# Patient Record
Sex: Male | Born: 1955 | Race: White | Hispanic: No | Marital: Single | Smoking: Current every day smoker
Health system: Southern US, Community
[De-identification: ages and names within clinical notes are randomized; demographics above are authoritative.]

## PROBLEM LIST (undated history)

## (undated) DIAGNOSIS — E119 Type 2 diabetes mellitus without complications: Secondary | ICD-10-CM

## (undated) DIAGNOSIS — I10 Essential (primary) hypertension: Secondary | ICD-10-CM

## (undated) DIAGNOSIS — G4730 Sleep apnea, unspecified: Secondary | ICD-10-CM

---

## 2012-07-09 ENCOUNTER — Emergency Department (HOSPITAL_COMMUNITY)
Admission: EM | Admit: 2012-07-09 | Discharge: 2012-07-09 | Disposition: A | Discharge status: Home / Self Care | Payer: BC Managed Care – PPO | Attending: Emergency Medicine | Admitting: Emergency Medicine

## 2012-07-09 ENCOUNTER — Encounter (HOSPITAL_COMMUNITY): Payer: Self-pay | Admitting: Cardiology

## 2012-07-09 ENCOUNTER — Emergency Department (HOSPITAL_COMMUNITY): Payer: BC Managed Care – PPO

## 2012-07-09 DIAGNOSIS — F17200 Nicotine dependence, unspecified, uncomplicated: Secondary | ICD-10-CM | POA: Insufficient documentation

## 2012-07-09 DIAGNOSIS — R209 Unspecified disturbances of skin sensation: Secondary | ICD-10-CM | POA: Insufficient documentation

## 2012-07-09 DIAGNOSIS — R7309 Other abnormal glucose: Secondary | ICD-10-CM | POA: Insufficient documentation

## 2012-07-09 DIAGNOSIS — R202 Paresthesia of skin: Secondary | ICD-10-CM

## 2012-07-09 DIAGNOSIS — R739 Hyperglycemia, unspecified: Secondary | ICD-10-CM

## 2012-07-09 DIAGNOSIS — Z7982 Long term (current) use of aspirin: Secondary | ICD-10-CM | POA: Insufficient documentation

## 2012-07-09 HISTORY — DX: Sleep apnea, unspecified: G47.30

## 2012-07-09 LAB — POCT I-STAT, CHEM 8
Calcium, Ion: 1.14 mmol/L (ref 1.12–1.23)
Chloride: 106 mEq/L (ref 96–112)
HCT: 45 % (ref 39.0–52.0)
TCO2: 22 mmol/L (ref 0–100)

## 2012-07-09 MED ORDER — METFORMIN HCL 500 MG PO TABS: 500.0000 mg | ORAL_TABLET | Freq: Two times a day (BID) | ORAL | Status: DC

## 2012-07-09 NOTE — ED Notes (Signed)
Pt to department via EMS from PCP office- pt reports he had an episode of tingling and discomfort in the right side of his chest last night. States this morning while at work he had the same episode and went to PCP office. Pt was sent here for further evaluation. Pt denies any pain at this time. Bp-136 pal Hr-94 18g RAC.

## 2012-07-09 NOTE — ED Provider Notes (Signed)
History     CSN: [id number]  Arrival date & time 07/09/12  1009   First MD Initiated Contact with Patient 07/09/12 1053      Chief Complaint  Patient presents with  . Chest Pain    (Consider location/radiation/quality/duration/timing/severity/associated sxs/prior treatment) HPI  Tingling right hand and right ribs occurred about 1130 last night occurring three times between onset and mn each occurring about one second with one episode of right shoulder tingled first episode. Patient went to sleep then notes again at 0700 getting ready to go to work and occurs more frequently from 7-8.  Continues to happen intermittently.  No similar symptoms previously, no sob, no cp, no weakness, no vision change.  PMD none.   Past Medical History  Diagnosis Date  . Sleep apnea     History reviewed. No pertinent past surgical history.  History reviewed. No pertinent family history.  History  Substance Use Topics  . Smoking status: Current Every Day Smoker  . Smokeless tobacco: Not on file  . Alcohol Use: Yes      Review of Systems  Constitutional: Negative.   HENT: Negative.   Eyes: Negative.   Respiratory: Negative.   Cardiovascular: Negative.   Gastrointestinal: Negative.   Genitourinary: Negative.   Musculoskeletal: Negative.   Neurological: Negative.   Hematological: Negative.   Psychiatric/Behavioral: Negative.     Allergies  Review of patient's allergies indicates no known allergies.  Home Medications   Current Outpatient Rx  Name  Route  Sig  Dispense  Refill  . ASPIRIN 325 MG PO TABS   Oral   Take 325 mg by mouth daily.         . CENTRUM SILVER PO   Oral   Take 1 tablet by mouth every morning.         Marland Kitchen FISH OIL PO   Oral   Take 1 capsule by mouth daily with breakfast.           BP 163/89  Pulse 92  Temp 98.3 F (36.8 C) (Oral)  Resp 14  SpO2 100%  Physical Exam  Nursing note and vitals reviewed. Constitutional: He appears well-developed  and well-nourished.  HENT:  Head: Normocephalic and atraumatic.  Right Ear: External ear normal.  Left Ear: External ear normal.  Nose: Nose normal.  Mouth/Throat: Oropharynx is clear and moist.  Eyes: Conjunctivae normal and EOM are normal. Pupils are equal, round, and reactive to light.  Neck: Normal range of motion. Neck supple.    ED Course  Procedures (including critical care time)  Labs Reviewed - No data to display No results found.   No diagnosis found.   Date: 07/09/2012  Rate: 94  Rhythm: normal sinus rhythm  QRS Axis: normal  Intervals: normal  ST/T Wave abnormalities: normal  Conduction Disutrbances:first-degree A-V block   Narrative Interpretation:   Old EKG Reviewed: none available    MDM        Patient with right sided tinglling in hand and right lateral chest with normal exam- no concerning history for cardiac etiology with no pain, tingling lasting seconds, and site in right lateral chest very discreet in size (about 2x2 cm).  Patient with normal ekg.  He does elevated bs and bp.  Discussed with patient and he will follow up with primary care this week.  He is given rx for metformin and discussed diet and exercise and smoking cessation.   Hilario Quarry, MD 07/09/12 1330

## 2012-09-23 ENCOUNTER — Encounter (HOSPITAL_COMMUNITY): Payer: Self-pay

## 2012-09-23 ENCOUNTER — Inpatient Hospital Stay (HOSPITAL_COMMUNITY)
Admission: EM | Admit: 2012-09-23 | Discharge: 2012-09-26 | DRG: 818 | Disposition: A | Discharge status: Home Health | Payer: BC Managed Care – PPO | Attending: Internal Medicine | Admitting: Internal Medicine

## 2012-09-23 ENCOUNTER — Emergency Department (HOSPITAL_COMMUNITY): Payer: BC Managed Care – PPO

## 2012-09-23 ENCOUNTER — Inpatient Hospital Stay (HOSPITAL_COMMUNITY): Payer: BC Managed Care – PPO

## 2012-09-23 DIAGNOSIS — S72141A Displaced intertrochanteric fracture of right femur, initial encounter for closed fracture: Secondary | ICD-10-CM

## 2012-09-23 DIAGNOSIS — E785 Hyperlipidemia, unspecified: Secondary | ICD-10-CM | POA: Diagnosis present

## 2012-09-23 DIAGNOSIS — S72009A Fracture of unspecified part of neck of unspecified femur, initial encounter for closed fracture: Secondary | ICD-10-CM

## 2012-09-23 DIAGNOSIS — I10 Essential (primary) hypertension: Secondary | ICD-10-CM | POA: Diagnosis present

## 2012-09-23 DIAGNOSIS — Y92410 Unspecified street and highway as the place of occurrence of the external cause: Secondary | ICD-10-CM

## 2012-09-23 DIAGNOSIS — S72001A Fracture of unspecified part of neck of right femur, initial encounter for closed fracture: Secondary | ICD-10-CM

## 2012-09-23 DIAGNOSIS — S72143A Displaced intertrochanteric fracture of unspecified femur, initial encounter for closed fracture: Principal | ICD-10-CM | POA: Diagnosis present

## 2012-09-23 DIAGNOSIS — E119 Type 2 diabetes mellitus without complications: Secondary | ICD-10-CM | POA: Diagnosis present

## 2012-09-23 DIAGNOSIS — G4730 Sleep apnea, unspecified: Secondary | ICD-10-CM | POA: Diagnosis present

## 2012-09-23 DIAGNOSIS — F17200 Nicotine dependence, unspecified, uncomplicated: Secondary | ICD-10-CM | POA: Diagnosis present

## 2012-09-23 HISTORY — DX: Type 2 diabetes mellitus without complications: E11.9

## 2012-09-23 HISTORY — DX: Essential (primary) hypertension: I10

## 2012-09-23 LAB — CBC WITH DIFFERENTIAL/PLATELET
Basophils Absolute: 0 10*3/uL (ref 0.0–0.1)
Lymphocytes Relative: 14 % (ref 12–46)
Neutro Abs: 13.6 10*3/uL — ABNORMAL HIGH (ref 1.7–7.7)
Neutrophils Relative %: 83 % — ABNORMAL HIGH (ref 43–77)
Platelets: 226 10*3/uL (ref 150–400)
RDW: 12.5 % (ref 11.5–15.5)
WBC: 16.5 10*3/uL — ABNORMAL HIGH (ref 4.0–10.5)

## 2012-09-23 LAB — BASIC METABOLIC PANEL
CO2: 21 mEq/L (ref 19–32)
Chloride: 98 mEq/L (ref 96–112)
Sodium: 135 mEq/L (ref 135–145)

## 2012-09-23 LAB — GLUCOSE, CAPILLARY: Glucose-Capillary: 180 mg/dL — ABNORMAL HIGH (ref 70–99)

## 2012-09-23 MED ORDER — INSULIN ASPART 100 UNIT/ML ~~LOC~~ SOLN
0.0000 [IU] | SUBCUTANEOUS | Status: DC
  Administered 2012-09-23: 3 [IU] via SUBCUTANEOUS
  Administered 2012-09-24: 2 [IU] via SUBCUTANEOUS
  Administered 2012-09-24: 3 [IU] via SUBCUTANEOUS

## 2012-09-23 MED ORDER — MORPHINE SULFATE 2 MG/ML IJ SOLN
0.5000 mg | INTRAMUSCULAR | Status: DC | PRN
  Administered 2012-09-23: 0.5 mg via INTRAVENOUS
  Filled 2012-09-23: qty 1

## 2012-09-23 MED ORDER — NICOTINE 14 MG/24HR TD PT24
14.0000 mg | MEDICATED_PATCH | Freq: Every day | TRANSDERMAL | Status: DC
  Administered 2012-09-23 – 2012-09-24 (×2): 14 mg via TRANSDERMAL
  Filled 2012-09-23 (×2): qty 1

## 2012-09-23 MED ORDER — LISINOPRIL 20 MG PO TABS
20.0000 mg | ORAL_TABLET | Freq: Every day | ORAL | Status: DC
  Administered 2012-09-23 – 2012-09-25 (×3): 20 mg via ORAL
  Filled 2012-09-23 (×6): qty 1

## 2012-09-23 MED ORDER — ONDANSETRON HCL 4 MG/2ML IJ SOLN
4.0000 mg | Freq: Once | INTRAMUSCULAR | Status: AC
  Administered 2012-09-23: 4 mg via INTRAVENOUS
  Filled 2012-09-23: qty 2

## 2012-09-23 MED ORDER — HYDROMORPHONE HCL PF 1 MG/ML IJ SOLN
1.0000 mg | INTRAMUSCULAR | Status: DC | PRN
  Administered 2012-09-23 – 2012-09-24 (×2): 1 mg via INTRAVENOUS
  Filled 2012-09-23 (×2): qty 1

## 2012-09-23 MED ORDER — HYDROCODONE-ACETAMINOPHEN 5-325 MG PO TABS: 1.0000 | ORAL_TABLET | Freq: Four times a day (QID) | ORAL | Status: DC | PRN

## 2012-09-23 MED ORDER — ASPIRIN 325 MG PO TABS
325.0000 mg | ORAL_TABLET | Freq: Every morning | ORAL | Status: DC
  Filled 2012-09-23: qty 1

## 2012-09-23 MED ORDER — HYDROMORPHONE HCL PF 1 MG/ML IJ SOLN
1.0000 mg | Freq: Once | INTRAMUSCULAR | Status: AC
  Administered 2012-09-23: 1 mg via INTRAVENOUS
  Filled 2012-09-23: qty 1

## 2012-09-23 MED ORDER — METOPROLOL TARTRATE 25 MG PO TABS
25.0000 mg | ORAL_TABLET | Freq: Every day | ORAL | Status: DC
  Administered 2012-09-23 – 2012-09-26 (×4): 25 mg via ORAL
  Filled 2012-09-23 (×4): qty 1

## 2012-09-23 MED ORDER — SODIUM CHLORIDE 0.9 % IV SOLN
Freq: Once | INTRAVENOUS | Status: AC
  Administered 2012-09-23: 17:00:00 via INTRAVENOUS

## 2012-09-23 MED ORDER — SIMVASTATIN 10 MG PO TABS
10.0000 mg | ORAL_TABLET | Freq: Every day | ORAL | Status: DC
  Administered 2012-09-23 – 2012-09-25 (×3): 10 mg via ORAL
  Filled 2012-09-23 (×5): qty 1
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
## 2012-09-23 NOTE — ED Notes (Signed)
shp at bedside of pt.
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
## 2012-09-23 NOTE — ED Notes (Signed)
Report called to [phone], taylor

## 2012-09-23 NOTE — ED Notes (Signed)
Report given to sam w/ [facility]
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
## 2012-09-23 NOTE — ED Notes (Signed)
Pt resting in bed, voices no complaints. Dr. zammit to see pt.

## 2012-09-23 NOTE — ED Notes (Signed)
[facility] diverted, [facility] to transport pt to [place].

## 2012-09-23 NOTE — ED Notes (Signed)
Pt transported to [place] by [facility]

## 2012-09-24 ENCOUNTER — Inpatient Hospital Stay (HOSPITAL_COMMUNITY): Payer: BC Managed Care – PPO

## 2012-09-24 ENCOUNTER — Encounter (HOSPITAL_COMMUNITY): Payer: Self-pay | Admitting: Registered Nurse

## 2012-09-24 ENCOUNTER — Encounter (HOSPITAL_COMMUNITY)
Admission: EM | Disposition: A | Discharge status: Home Health | Payer: Self-pay | Source: Home / Self Care | Attending: Internal Medicine

## 2012-09-24 ENCOUNTER — Inpatient Hospital Stay (HOSPITAL_COMMUNITY): Payer: BC Managed Care – PPO | Admitting: Anesthesiology

## 2012-09-24 ENCOUNTER — Encounter (HOSPITAL_COMMUNITY): Payer: Self-pay | Admitting: Anesthesiology

## 2012-09-24 DIAGNOSIS — E119 Type 2 diabetes mellitus without complications: Secondary | ICD-10-CM

## 2012-09-24 DIAGNOSIS — I10 Essential (primary) hypertension: Secondary | ICD-10-CM

## 2012-09-24 HISTORY — PX: TOTAL HIP ARTHROPLASTY: SHX124

## 2012-09-24 LAB — GLUCOSE, CAPILLARY
Glucose-Capillary: 227 mg/dL — ABNORMAL HIGH (ref 70–99)
Glucose-Capillary: 217 mg/dL — ABNORMAL HIGH (ref 70–99)
Glucose-Capillary: 183 mg/dL — ABNORMAL HIGH (ref 70–99)
Glucose-Capillary: 176 mg/dL — ABNORMAL HIGH (ref 70–99)
Glucose-Capillary: 172 mg/dL — ABNORMAL HIGH (ref 70–99)

## 2012-09-24 LAB — SURGICAL PCR SCREEN: MRSA, PCR: NEGATIVE

## 2012-09-24 LAB — HEMOGLOBIN A1C: Mean Plasma Glucose: 192 mg/dL — ABNORMAL HIGH (ref <117)

## 2012-09-24 SURGERY — ARTHROPLASTY, HIP, TOTAL, ANTERIOR APPROACH
Anesthesia: General | Laterality: Right | Wound class: Clean

## 2012-09-24 MED ORDER — INSULIN ASPART 100 UNIT/ML ~~LOC~~ SOLN: 0.0000 [IU] | Freq: Every day | SUBCUTANEOUS | Status: DC

## 2012-09-24 MED ORDER — LACTATED RINGERS IV SOLN
INTRAVENOUS | Status: DC | PRN
  Administered 2012-09-24: 13:00:00 via INTRAVENOUS

## 2012-09-24 MED ORDER — LACTATED RINGERS IV SOLN
INTRAVENOUS | Status: DC | PRN
  Administered 2012-09-24 (×2): via INTRAVENOUS

## 2012-09-24 MED ORDER — METOCLOPRAMIDE HCL 10 MG PO TABS: 5.0000 mg | ORAL_TABLET | Freq: Three times a day (TID) | ORAL | Status: DC | PRN

## 2012-09-24 MED ORDER — CEFAZOLIN SODIUM-DEXTROSE 2-3 GM-% IV SOLR
2.0000 g | Freq: Four times a day (QID) | INTRAVENOUS | Status: AC
  Administered 2012-09-24 – 2012-09-25 (×2): 2 g via INTRAVENOUS
  Filled 2012-09-24 (×2): qty 50

## 2012-09-24 MED ORDER — NICOTINE 21 MG/24HR TD PT24
21.0000 mg | MEDICATED_PATCH | Freq: Every day | TRANSDERMAL | Status: DC
Start: 2012-09-25 — End: 2012-09-26
  Administered 2012-09-25 – 2012-09-26 (×2): 21 mg via TRANSDERMAL
  Filled 2012-09-24 (×2): qty 1

## 2012-09-24 MED ORDER — NEOSTIGMINE METHYLSULFATE 1 MG/ML IJ SOLN
INTRAMUSCULAR | Status: DC | PRN
  Administered 2012-09-24: 4 mg via INTRAVENOUS

## 2012-09-24 MED ORDER — CEFAZOLIN SODIUM-DEXTROSE 2-3 GM-% IV SOLR
2.0000 g | Freq: Once | INTRAVENOUS | Status: AC
  Administered 2012-09-24: 2 g via INTRAVENOUS
  Filled 2012-09-24: qty 50

## 2012-09-24 MED ORDER — ACETAMINOPHEN 10 MG/ML IV SOLN: 1000.0000 mg | Freq: Once | INTRAVENOUS | Status: DC | PRN

## 2012-09-24 MED ORDER — ACETAMINOPHEN 10 MG/ML IV SOLN
INTRAVENOUS | Status: DC | PRN
  Administered 2012-09-24: 1000 mg via INTRAVENOUS

## 2012-09-24 MED ORDER — FERROUS SULFATE 325 (65 FE) MG PO TABS
325.0000 mg | ORAL_TABLET | Freq: Three times a day (TID) | ORAL | Status: DC
  Administered 2012-09-24 – 2012-09-26 (×5): 325 mg via ORAL
  Filled 2012-09-24 (×8): qty 1

## 2012-09-24 MED ORDER — METOCLOPRAMIDE HCL 5 MG/ML IJ SOLN: 5.0000 mg | Freq: Three times a day (TID) | INTRAMUSCULAR | Status: DC | PRN

## 2012-09-24 MED ORDER — SODIUM CHLORIDE 0.9 % IV SOLN
INTRAVENOUS | Status: DC
  Administered 2012-09-24 – 2012-09-26 (×4): via INTRAVENOUS

## 2012-09-24 MED ORDER — ACETAMINOPHEN 650 MG RE SUPP: 650.0000 mg | Freq: Four times a day (QID) | RECTAL | Status: DC | PRN

## 2012-09-24 MED ORDER — PROPOFOL 10 MG/ML IV BOLUS
INTRAVENOUS | Status: DC | PRN
  Administered 2012-09-24: 180 mg via INTRAVENOUS

## 2012-09-24 MED ORDER — METHOCARBAMOL 500 MG PO TABS
500.0000 mg | ORAL_TABLET | Freq: Four times a day (QID) | ORAL | Status: DC | PRN
  Administered 2012-09-24 – 2012-09-25 (×2): 500 mg via ORAL
  Filled 2012-09-24 (×2): qty 1

## 2012-09-24 MED ORDER — ROCURONIUM BROMIDE 100 MG/10ML IV SOLN
INTRAVENOUS | Status: DC | PRN
  Administered 2012-09-24: 50 mg via INTRAVENOUS
  Administered 2012-09-24: 20 mg via INTRAVENOUS

## 2012-09-24 MED ORDER — ASPIRIN EC 325 MG PO TBEC
325.0000 mg | DELAYED_RELEASE_TABLET | Freq: Two times a day (BID) | ORAL | Status: DC
  Administered 2012-09-25 – 2012-09-26 (×3): 325 mg via ORAL
  Filled 2012-09-24 (×5): qty 1

## 2012-09-24 MED ORDER — HYDROMORPHONE HCL PF 1 MG/ML IJ SOLN: 1.0000 mg | INTRAMUSCULAR | Status: DC | PRN

## 2012-09-24 MED ORDER — PROMETHAZINE HCL 25 MG/ML IJ SOLN: 6.2500 mg | INTRAMUSCULAR | Status: DC | PRN

## 2012-09-24 MED ORDER — METHOCARBAMOL 100 MG/ML IJ SOLN: 500.0000 mg | Freq: Four times a day (QID) | INTRAVENOUS | Status: DC | PRN

## 2012-09-24 MED ORDER — MIDAZOLAM HCL 5 MG/5ML IJ SOLN
INTRAMUSCULAR | Status: DC | PRN
  Administered 2012-09-24: 2 mg via INTRAVENOUS

## 2012-09-24 MED ORDER — OXYCODONE HCL 5 MG/5ML PO SOLN
5.0000 mg | Freq: Once | ORAL | Status: DC | PRN
  Filled 2012-09-24: qty 5

## 2012-09-24 MED ORDER — LIDOCAINE HCL (CARDIAC) 20 MG/ML IV SOLN: INTRAVENOUS | Status: DC | PRN

## 2012-09-24 MED ORDER — MENTHOL 3 MG MT LOZG
1.0000 | LOZENGE | OROMUCOSAL | Status: DC | PRN
  Filled 2012-09-24: qty 9

## 2012-09-24 MED ORDER — ONDANSETRON HCL 4 MG/2ML IJ SOLN: 4.0000 mg | Freq: Four times a day (QID) | INTRAMUSCULAR | Status: DC | PRN

## 2012-09-24 MED ORDER — FENTANYL CITRATE 0.05 MG/ML IJ SOLN
INTRAMUSCULAR | Status: DC | PRN
  Administered 2012-09-24: 100 ug via INTRAVENOUS
  Administered 2012-09-24 (×3): 50 ug via INTRAVENOUS

## 2012-09-24 MED ORDER — HYDROMORPHONE HCL PF 1 MG/ML IJ SOLN: 0.2500 mg | INTRAMUSCULAR | Status: DC | PRN

## 2012-09-24 MED ORDER — 0.9 % SODIUM CHLORIDE (POUR BTL) OPTIME
TOPICAL | Status: DC | PRN
  Administered 2012-09-24: 1000 mL

## 2012-09-24 MED ORDER — ONDANSETRON HCL 4 MG/2ML IJ SOLN
INTRAMUSCULAR | Status: DC | PRN
  Administered 2012-09-24: 4 mg via INTRAVENOUS

## 2012-09-24 MED ORDER — ONDANSETRON HCL 4 MG PO TABS: 4.0000 mg | ORAL_TABLET | Freq: Four times a day (QID) | ORAL | Status: DC | PRN

## 2012-09-24 MED ORDER — OXYCODONE HCL 5 MG PO TABS: 5.0000 mg | ORAL_TABLET | Freq: Once | ORAL | Status: DC | PRN

## 2012-09-24 MED ORDER — HYDROCODONE-ACETAMINOPHEN 5-325 MG PO TABS
1.0000 | ORAL_TABLET | Freq: Four times a day (QID) | ORAL | Status: DC | PRN
  Administered 2012-09-24: 1 via ORAL
  Administered 2012-09-25: 2 via ORAL
  Filled 2012-09-24: qty 1
  Filled 2012-09-24 (×2): qty 2

## 2012-09-24 MED ORDER — STERILE WATER FOR IRRIGATION IR SOLN
Status: DC | PRN
  Administered 2012-09-24: 3000 mL

## 2012-09-24 MED ORDER — PHENYLEPHRINE HCL 10 MG/ML IJ SOLN
INTRAMUSCULAR | Status: DC | PRN
  Administered 2012-09-24 (×4): 40 ug via INTRAVENOUS
  Administered 2012-09-24: 80 ug via INTRAVENOUS

## 2012-09-24 MED ORDER — MEPERIDINE HCL 50 MG/ML IJ SOLN: 6.2500 mg | INTRAMUSCULAR | Status: DC | PRN

## 2012-09-24 MED ORDER — GLYCOPYRROLATE 0.2 MG/ML IJ SOLN
INTRAMUSCULAR | Status: DC | PRN
  Administered 2012-09-24: .7 mg via INTRAVENOUS

## 2012-09-24 MED ORDER — INSULIN ASPART 100 UNIT/ML ~~LOC~~ SOLN
0.0000 [IU] | Freq: Three times a day (TID) | SUBCUTANEOUS | Status: DC
  Administered 2012-09-25 (×2): 3 [IU] via SUBCUTANEOUS
  Administered 2012-09-25: 2 [IU] via SUBCUTANEOUS
  Administered 2012-09-26: 3 [IU] via SUBCUTANEOUS
  Administered 2012-09-26: 2 [IU] via SUBCUTANEOUS

## 2012-09-24 MED ORDER — HYDROMORPHONE HCL PF 1 MG/ML IJ SOLN
0.5000 mg | INTRAMUSCULAR | Status: DC | PRN
  Administered 2012-09-24 – 2012-09-25 (×2): 1 mg via INTRAVENOUS
  Filled 2012-09-24 (×2): qty 1

## 2012-09-24 MED ORDER — EPHEDRINE SULFATE 50 MG/ML IJ SOLN
INTRAMUSCULAR | Status: DC | PRN
  Administered 2012-09-24 (×2): 10 mg via INTRAVENOUS

## 2012-09-24 MED ORDER — ACETAMINOPHEN 325 MG PO TABS: 650.0000 mg | ORAL_TABLET | Freq: Four times a day (QID) | ORAL | Status: DC | PRN

## 2012-09-24 MED ORDER — PHENOL 1.4 % MT LIQD
1.0000 | OROMUCOSAL | Status: DC | PRN
  Filled 2012-09-24: qty 177

## 2012-09-24 SURGICAL SUPPLY — 37 items
BAG ZIPLOCK 12X15 (MISCELLANEOUS) ×4 IMPLANT
BLADE SAGITTAL 25.0X1.27X90 (BLADE) ×2 IMPLANT
CLOTH BEACON ORANGE TIMEOUT ST (SAFETY) ×2 IMPLANT
DERMABOND ADVANCED (GAUZE/BANDAGES/DRESSINGS) ×1
DERMABOND ADVANCED .7 DNX12 (GAUZE/BANDAGES/DRESSINGS) ×1 IMPLANT
DRAPE C-ARM 42X72 X-RAY (DRAPES) ×2 IMPLANT
DRAPE STERI IOBAN 125X83 (DRAPES) ×2 IMPLANT
DRAPE U-SHAPE 47X51 STRL (DRAPES) ×6 IMPLANT
DRSG AQUACEL AG ADV 3.5X 6 (GAUZE/BANDAGES/DRESSINGS) ×2 IMPLANT
DRSG AQUACEL AG ADV 3.5X10 (GAUZE/BANDAGES/DRESSINGS) ×2 IMPLANT
DRSG TEGADERM 4X4.75 (GAUZE/BANDAGES/DRESSINGS) ×2 IMPLANT
DURAPREP 26ML APPLICATOR (WOUND CARE) ×2 IMPLANT
ELECT BLADE TIP CTD 4 INCH (ELECTRODE) ×2 IMPLANT
ELECT REM PT RETURN 9FT ADLT (ELECTROSURGICAL) ×2
ELECTRODE REM PT RTRN 9FT ADLT (ELECTROSURGICAL) ×1 IMPLANT
EVACUATOR 1/8 PVC DRAIN (DRAIN) IMPLANT
FACESHIELD LNG OPTICON STERILE (SAFETY) ×8 IMPLANT
GAUZE SPONGE 2X2 8PLY STRL LF (GAUZE/BANDAGES/DRESSINGS) ×1 IMPLANT
GLOVE BIOGEL PI IND STRL 7.5 (GLOVE) ×1 IMPLANT
GLOVE BIOGEL PI IND STRL 8 (GLOVE) ×1 IMPLANT
GLOVE BIOGEL PI INDICATOR 7.5 (GLOVE) ×1
GLOVE BIOGEL PI INDICATOR 8 (GLOVE) ×1
GLOVE ECLIPSE 8.0 STRL XLNG CF (GLOVE) ×2 IMPLANT
GLOVE ORTHO TXT STRL SZ7.5 (GLOVE) ×4 IMPLANT
GOWN BRE IMP PREV XXLGXLNG (GOWN DISPOSABLE) ×2 IMPLANT
GOWN STRL NON-REIN LRG LVL3 (GOWN DISPOSABLE) ×2 IMPLANT
KIT BASIN OR (CUSTOM PROCEDURE TRAY) ×2 IMPLANT
PACK TOTAL JOINT (CUSTOM PROCEDURE TRAY) ×2 IMPLANT
PADDING CAST COTTON 6X4 STRL (CAST SUPPLIES) ×2 IMPLANT
SPONGE GAUZE 2X2 STER 10/PKG (GAUZE/BANDAGES/DRESSINGS) ×1
SUCTION FRAZIER 12FR DISP (SUCTIONS) ×2 IMPLANT
SUT MNCRL AB 4-0 PS2 18 (SUTURE) ×2 IMPLANT
SUT VIC AB 1 CT1 36 (SUTURE) ×6 IMPLANT
SUT VIC AB 2-0 CT1 27 (SUTURE) ×2
SUT VIC AB 2-0 CT1 TAPERPNT 27 (SUTURE) ×2 IMPLANT
SUT VLOC 180 0 24IN GS25 (SUTURE) ×2 IMPLANT
TOWEL OR 17X26 10 PK STRL BLUE (TOWEL DISPOSABLE) ×4 IMPLANT

## 2012-09-24 NOTE — Transfer of Care (Signed)
Immediate Anesthesia Transfer of Care Note  Patient: Brady [name]  Procedure(s) Performed: Procedure(s) (LRB): TOTAL HIP ARTHROPLASTY ANTERIOR APPROACH (Right)  Patient Location: PACU  Anesthesia Type: General  Level of Consciousness: sedated, patient cooperative and responds to stimulaton  Airway & Oxygen Therapy: Patient Spontanous Breathing and Patient connected to face mask oxgen  Post-op Assessment: Report given to PACU RN and Post -op Vital signs reviewed and stable  Post vital signs: Reviewed and stable  Complications: No apparent anesthesia complications

## 2012-09-24 NOTE — Progress Notes (Signed)
Called 6 east to give report states RN will call back
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
## 2012-09-24 NOTE — Anesthesia Postprocedure Evaluation (Signed)
Anesthesia Post Note  Patient: Brady [name]  Procedure(s) Performed: Procedure(s) (LRB): TOTAL HIP ARTHROPLASTY ANTERIOR APPROACH (Right)  Anesthesia type: General  Patient location: PACU  Post pain: Pain level controlled  Post assessment: Post-op Vital signs reviewed  Last Vitals: BP 103/73  Pulse 96  Temp(Src) 37.1 C (Oral)  Resp 18  Ht 5\' 9"  (1.753 m)  Wt 230 lb (104.327 kg)  BMI 33.95 kg/m2  SpO2 92%  Post vital signs: Reviewed  Level of consciousness: sedated  Complications: No apparent anesthesia complications
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
## 2012-09-24 NOTE — Progress Notes (Signed)
[facility] PROGRESS NOTE  Brady [name] ZOX:[id number] DOB: Jun 27, 1955 DOA: 09/23/2012 PCP: Default, Provider, MD  Assessment/Plan:  1. Right femoral neck fracture: ortho on board. Plan for ORIF today. Pain control and PT EVAL.   2. Diabetes Mellitus:  CBG (last 3)   Recent Labs  09/24/12 0424 09/24/12 0805 09/24/12 1525  GLUCAP 217* 181* 183*    SSI.   3. Hypertension: resume home medications/    DVT prophylaxis.  Consultants:  Orthopedics.   HPI/Subjective: Pain is better.   Objective: Filed Vitals:   09/24/12 1600 09/24/12 1615 09/24/12 1734 09/24/12 1824  BP: 114/65 103/73 111/72 112/70  Pulse: 95 96 98 102  Temp: 98.3 F (36.8 C) 98.8 F (37.1 C) 98.3 F (36.8 C) 98.9 F (37.2 C)  TempSrc:    Oral  Resp: 20 18 18 16   Height:      Weight:      SpO2: 94% 92% 96% 97%    Intake/Output Summary (Last 24 hours) at 09/24/12 1855 Last data filed at 09/24/12 1600  Gross per 24 hour  Intake   3185 ml  Output   1775 ml  Net   1410 ml   Filed Weights   09/23/12 1459  Weight: 104.327 kg (230 lb)    Exam: Cardiovascular: No murmur heard. Musculoskeletal: Normal range of motion. He exhibits tenderness. He exhibits edema over the right lateral thigh. Tenderness on palpation to the right hip; normal sensation and strength to bilateral lower extremities.  Neurological: He is oriented to person, place, and time.  Skin: Skin is warm.  Psychiatric: He has a normal mood and affect     Data Reviewed: Basic Metabolic Panel:  Recent Labs Lab 09/23/12 1725  NA 135  K 3.9  CL 98  CO2 21  GLUCOSE 184*  BUN 10  CREATININE 0.65  CALCIUM 9.4   Liver Function Tests: No results found for this basename: AST, ALT, ALKPHOS, BILITOT, PROT, ALBUMIN,  in the last 168 hours No results found for this basename: LIPASE, AMYLASE,  in the last 168 hours No results found for this basename: AMMONIA,  in the last 168 hours CBC:  Recent Labs Lab  09/23/12 1725  WBC 16.5*  NEUTROABS 13.6*  HGB 15.3  HCT 42.9  MCV 90.5  PLT 226   Cardiac Enzymes: No results found for this basename: CKTOTAL, CKMB, CKMBINDEX, TROPONINI,  in the last 168 hours BNP (last 3 results) No results found for this basename: PROBNP,  in the last 8760 hours CBG:  Recent Labs Lab 09/23/12 1650 09/23/12 2158 09/24/12 0424 09/24/12 0805 09/24/12 1525  GLUCAP 180* 227* 217* 181* 183*    Recent Results (from the past 240 hour(s))  SURGICAL PCR SCREEN     Status: None   Collection Time    09/24/12 10:30 AM      Result Value Range Status   MRSA, PCR NEGATIVE  NEGATIVE Final   Staphylococcus aureus NEGATIVE  NEGATIVE Final   Comment:            The Xpert SA Assay (FDA     approved for NASAL specimens     in patients over 1 years of age),     is one component of     a comprehensive surveillance     program.  Test performance has     been validated by [employer] for patients greater     than or equal to 29 year old.  It is not intended     to diagnose infection nor to     guide or monitor treatment.     Studies: Dg Hip Complete Right  09/23/2012  *RADIOLOGY REPORT*  Clinical Data: Larey Seat.  Right leg pain.  RIGHT HIP - COMPLETE 2+ VIEW  Comparison: None  Findings: There is a displaced femoral neck fracture with a varus deformity.  The pubic symphysis and SI joints are intact.  No pelvic fractures.  The left hip is intact.  IMPRESSION: Displaced right femoral neck fracture.   Original Report Authenticated By: Rudie Meyer, M.D.    Dg Pelvis Portable  09/24/2012  *RADIOLOGY REPORT*  Clinical Data: Postoperative right total hip replacement.  PORTABLE PELVIS  Comparison: 09/23/2012  Findings: Right total hip prosthesis is in place without findings of fracture, dislocation, or other acute complicating feature. Lateral inclination of the acetabular shell component 39 degrees, within normal limits.  A drain is in place.  IMPRESSION:  1.  Right total  hip prosthesis noted.   Original Report Authenticated By: Gaylyn Rong, M.D.    Dg Chest Portable 1 View  09/23/2012  *RADIOLOGY REPORT*  Clinical Data: Preop for hip fracture.  Diabetic.  Hypertension. Smoker.  PORTABLE CHEST - 1 VIEW  Comparison: 07/09/2012  Findings: Midline trachea.  Borderline cardiomegaly, accentuated by technique. No pleural effusion or pneumothorax. Left costophrenic angle is minimally excluded. No congestive failure.  Clear lungs.  IMPRESSION: No acute cardiopulmonary disease.   Original Report Authenticated By: Jeronimo Greaves, M.D.    Dg Hip Portable 1 View Right  09/24/2012  *RADIOLOGY REPORT*  Clinical Data: Right hip replacement, postop  PORTABLE RIGHT HIP - 1 VIEW  Comparison: Portable exam 1533 hours compared to 09/23/2012  Findings: Acetabular and femoral components of hip prosthesis are identified. No acute fracture or dislocation. Bones appear demineralized.  IMPRESSION: Right hip prosthesis without acute abnormalities.   Original Report Authenticated By: Ulyses Southward, M.D.     Scheduled Meds: . [START ON 09/25/2012] aspirin EC  325 mg Oral BID  .  ceFAZolin (ANCEF) IV  2 g Intravenous Q6H  . ferrous sulfate  325 mg Oral TID PC  . insulin aspart  0-9 Units Subcutaneous Q4H  . lisinopril  20 mg Oral QHS  . metoprolol tartrate  25 mg Oral Daily  . [START ON 09/25/2012] nicotine  21 mg Transdermal Daily  . simvastatin  10 mg Oral QHS   Continuous Infusions: . sodium chloride 50 mL/hr at 09/24/12 1730    Principal Problem:   Intertrochanteric fracture of right hip Active Problems:   Type II or unspecified type diabetes mellitus without mention of complication, not stated as uncontrolled   HTN (hypertension)    Time spent: 30  min    Boyd Litaker  [facility] Pager [phone]. If 7PM-7AM, please contact night-coverage at [email or web address], password [facility] 09/24/2012, 6:55 PM  LOS: 1 day

## 2012-09-24 NOTE — Anesthesia Preprocedure Evaluation (Addendum)
Anesthesia Evaluation  Patient identified by MRN, date of birth, ID band Patient awake    Reviewed: Allergy & Precautions, H&P , NPO status , Patient's Chart, lab work & pertinent test results  Airway Mallampati: II TM Distance: >3 FB Neck ROM: Full    Dental  (+) Dental Advisory Given and Teeth Intact   Pulmonary sleep apnea , former smoker,  breath sounds clear to auscultation        Cardiovascular hypertension, Pt. on medications Rhythm:Regular Rate:Normal     Neuro/Psych negative neurological ROS  negative psych ROS   GI/Hepatic negative GI ROS, Neg liver ROS,   Endo/Other  diabetes, Type 2, Oral Hypoglycemic Agents  Renal/GU negative Renal ROS     Musculoskeletal negative musculoskeletal ROS (+)   Abdominal   Peds  Hematology negative hematology ROS (+)   Anesthesia Other Findings   Reproductive/Obstetrics                          Anesthesia Physical Anesthesia Plan  ASA: III  Anesthesia Plan: General   Post-op Pain Management:    Induction: Intravenous  Airway Management Planned:   Additional Equipment:   Intra-op Plan:   Post-operative Plan: Extubation in OR  Informed Consent: I have reviewed the patients History and Physical, chart, labs and discussed the procedure including the risks, benefits and alternatives for the proposed anesthesia with the patient or authorized representative who has indicated his/her understanding and acceptance.   Dental advisory given  Plan Discussed with: CRNA  Anesthesia Plan Comments:        Anesthesia Quick Evaluation

## 2012-09-24 NOTE — Progress Notes (Signed)
Patient sent to OR, per OR staff Dr. Charlann Boxer is going to further talk to patient in holding
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
## 2012-09-24 NOTE — Progress Notes (Signed)
Inpatient Diabetes Program Recommendations  AACE/ADA: New Consensus Statement on Inpatient Glycemic Control (2013)  Target Ranges:  Prepandial:   less than 140 mg/dL      Peak postprandial:   less than 180 mg/dL (1-2 hours)      Critically ill patients:  140 - 180 mg/dL   Reason for Visit: Hyperglycemia  Pt in PACU at present.  Hx Type 2 DM on metformin 1000 mg bid at home.  HgbA1C 8.3% indicative of sub-optimal glucose control at home.  Inpatient Diabetes Program Recommendations Insulin - Basal: May need small amount of basal insulin if blood sugars > 180mg /dL Correction (SSI): When pt eating, Novolog moderate tidwc and hs HgbA1C: 8.3% on 09/23/2012 Outpatient Referral: [facility] benefit from [facility] for elevated HgbA1C of 8.3%  Note: Will f/u with pt tomorrow morning.  Thank you. Ailene Ards, RD, LDN, CDE Inpatient Diabetes Coordinator [phone]

## 2012-09-25 ENCOUNTER — Encounter (HOSPITAL_COMMUNITY): Payer: Self-pay | Admitting: Orthopedic Surgery

## 2012-09-25 LAB — CBC
HCT: 37.9 % — ABNORMAL LOW (ref 39.0–52.0)
MCH: 32.1 pg (ref 26.0–34.0)
MCHC: 35.1 g/dL (ref 30.0–36.0)
RDW: 12.8 % (ref 11.5–15.5)

## 2012-09-25 LAB — URINE MICROSCOPIC-ADD ON

## 2012-09-25 LAB — BASIC METABOLIC PANEL
BUN: 9 mg/dL (ref 6–23)
Calcium: 8.3 mg/dL — ABNORMAL LOW (ref 8.4–10.5)
GFR calc Af Amer: >90 mL/min (ref >90)
GFR calc non Af Amer: >90 mL/min (ref >90)
Glucose, Bld: 205 mg/dL — ABNORMAL HIGH (ref 70–99)
Potassium: 3.8 mEq/L (ref 3.5–5.1)

## 2012-09-25 LAB — GLUCOSE, CAPILLARY
Glucose-Capillary: 156 mg/dL — ABNORMAL HIGH (ref 70–99)
Glucose-Capillary: 139 mg/dL — ABNORMAL HIGH (ref 70–99)

## 2012-09-25 LAB — URINALYSIS, ROUTINE W REFLEX MICROSCOPIC
Bilirubin Urine: NEGATIVE
Hgb urine dipstick: NEGATIVE
Specific Gravity, Urine: 1.027 (ref 1.005–1.030)
Urobilinogen, UA: 0.2 mg/dL (ref 0.0–1.0)

## 2012-09-25 MED ORDER — HYDROCODONE-ACETAMINOPHEN 5-325 MG PO TABS: 1.0000 | ORAL_TABLET | ORAL | Status: DC | PRN

## 2012-09-25 MED ORDER — HYDROCODONE-ACETAMINOPHEN 5-325 MG PO TABS: 1.0000 | ORAL_TABLET | ORAL | Status: AC | PRN

## 2012-09-25 MED ORDER — METHOCARBAMOL 500 MG PO TABS: 500.0000 mg | ORAL_TABLET | Freq: Four times a day (QID) | ORAL | Status: AC | PRN

## 2012-09-25 MED ORDER — HYDROMORPHONE HCL PF 1 MG/ML IJ SOLN
0.5000 mg | INTRAMUSCULAR | Status: DC | PRN
  Administered 2012-09-25: 1 mg via INTRAVENOUS
  Filled 2012-09-25: qty 1

## 2012-09-25 MED ORDER — ASPIRIN 325 MG PO TBEC: 325.0000 mg | DELAYED_RELEASE_TABLET | Freq: Two times a day (BID) | ORAL | Status: DC

## 2012-09-25 NOTE — Progress Notes (Addendum)
   Subjective: 1 Day Post-Op Procedure(s) (LRB): TOTAL HIP ARTHROPLASTY ANTERIOR APPROACH (Right)   Patient reports pain as mild, pain well controlled. Lacking some flexion at the hip. Throoughly discussed the implants, procedure and no restrictions. No events throughout the night.   Objective:   VITALS:   Filed Vitals:   09/25/12 0532  BP: 105/72  Pulse: 102  Temp: 99.9 F (37.7 C)  Resp: 16    Neurovascular intact Dorsiflexion/Plantar flexion intact Incision: dressing C/D/I No cellulitis present Compartment soft  LABS  Recent Labs  09/23/12 1725 09/25/12 0449  HGB 15.3 13.3  HCT 42.9 37.9*  WBC 16.5* 13.2*  PLT 226 174     Recent Labs  09/23/12 1725 09/25/12 0449  NA 135 131*  K 3.9 3.8  BUN 10 9  CREATININE 0.65 0.64  GLUCOSE 184* 205*     Assessment/Plan: 1 Day Post-Op Procedure(s) (LRB): TOTAL HIP ARTHROPLASTY ANTERIOR APPROACH (Right) Foley cath can be d/c'ed after PT, to see how well he does. HV drain d/c'ed Advance diet Up with therapy Discharge home with home health eventually, when medically ready Orthopaedically stable Norco for pain, Rx written Robaxin for muscle spasms, Rx written ASA bid for 4 weeks for anticoagulation WBAT on the right leg Follow up in 2 weeks at [facility]. Follow up with OLIN,Lyan Holck D in 2 weeks.  Contact information:  [facility] [address] [place] [address] [phone]        Anastasio Auerbach. Charlott Calvario   PAC  09/25/2012, 7:59 AM

## 2012-09-25 NOTE — Progress Notes (Signed)
[facility] PROGRESS NOTE  Brady [name] WUJ:[id number] DOB: 05/14/1956 DOA: 09/23/2012 PCP: Default, Provider, MD  Assessment/Plan:  1. Right femoral neck fracture: ortho on board.  Underwent total hip arthoplasty. Pain control and PT EVAL recommended home health PT.   2. Diabetes Mellitus:  CBG (last 3)   Recent Labs  09/25/12 0315 09/25/12 0725 09/25/12 1134  GLUCAP 210* 193* 156*  HGBA1C is 8.3.  RESUME metformin and SSI on discharge.     3. Hypertension: resume home medications/   4. Tobacco abuse; cessation counseling and nicotine patch ordered.    DVT prophylaxis.   Disposition: possible discharge in am if pain is controlled.   Consultants:  Orthopedics.   HPI/Subjective: Pain is better.   Objective: Filed Vitals:   09/25/12 0532 09/25/12 1000 09/25/12 1006 09/25/12 1350  BP: 105/72 91/41 90/50  102/54  Pulse: 102 98  97  Temp: 99.9 F (37.7 C) 99.2 F (37.3 C)  100.3 F (37.9 C)  TempSrc: Oral Oral  Oral  Resp: 16 16  18   Height:      Weight:      SpO2: 97% 93%  94%    Intake/Output Summary (Last 24 hours) at 09/25/12 1643 Last data filed at 09/25/12 1400  Gross per 24 hour  Intake 1451.67 ml  Output   1865 ml  Net -413.33 ml   Filed Weights   09/23/12 1459  Weight: 104.327 kg (230 lb)    Exam: Cardiovascular: No murmur heard. Musculoskeletal: Normal range of motion. He exhibits tenderness. He exhibits edema over the right lateral thigh. Tenderness on palpation to the right hip; normal sensation and strength to bilateral lower extremities.  Neurological: He is oriented to person, place, and time.  Skin: Skin is warm.  Psychiatric: He has a normal mood and affect     Data Reviewed: Basic Metabolic Panel:  Recent Labs Lab 09/23/12 1725 09/25/12 0449  NA 135 131*  K 3.9 3.8  CL 98 97  CO2 21 23  GLUCOSE 184* 205*  BUN 10 9  CREATININE 0.65 0.64  CALCIUM 9.4 8.3*   Liver Function Tests: No results found for this  basename: AST, ALT, ALKPHOS, BILITOT, PROT, ALBUMIN,  in the last 168 hours No results found for this basename: LIPASE, AMYLASE,  in the last 168 hours No results found for this basename: AMMONIA,  in the last 168 hours CBC:  Recent Labs Lab 09/23/12 1725 09/25/12 0449  WBC 16.5* 13.2*  NEUTROABS 13.6*  --   HGB 15.3 13.3  HCT 42.9 37.9*  MCV 90.5 91.5  PLT 226 174   Cardiac Enzymes: No results found for this basename: CKTOTAL, CKMB, CKMBINDEX, TROPONINI,  in the last 168 hours BNP (last 3 results) No results found for this basename: PROBNP,  in the last 8760 hours CBG:  Recent Labs Lab 09/24/12 2142 09/25/12 0007 09/25/12 0315 09/25/12 0725 09/25/12 1134  GLUCAP 172* 233* 210* 193* 156*    Recent Results (from the past 240 hour(s))  SURGICAL PCR SCREEN     Status: None   Collection Time    09/24/12 10:30 AM      Result Value Range Status   MRSA, PCR NEGATIVE  NEGATIVE Final   Staphylococcus aureus NEGATIVE  NEGATIVE Final   Comment:            The Xpert SA Assay (FDA     approved for NASAL specimens     in patients over 105 years of age),  is one component of     a comprehensive surveillance     program.  Test performance has     been validated by [facility] for patients greater     than or equal to 31 year old.     It is not intended     to diagnose infection nor to     guide or monitor treatment.     Studies: Dg Pelvis Portable  09/24/2012  *RADIOLOGY REPORT*  Clinical Data: Postoperative right total hip replacement.  PORTABLE PELVIS  Comparison: 09/23/2012  Findings: Right total hip prosthesis is in place without findings of fracture, dislocation, or other acute complicating feature. Lateral inclination of the acetabular shell component 39 degrees, within normal limits.  A drain is in place.  IMPRESSION:  1.  Right total hip prosthesis noted.   Original Report Authenticated By: Gaylyn Rong, M.D.    Dg Chest Portable 1 View  09/23/2012   *RADIOLOGY REPORT*  Clinical Data: Preop for hip fracture.  Diabetic.  Hypertension. Smoker.  PORTABLE CHEST - 1 VIEW  Comparison: 07/09/2012  Findings: Midline trachea.  Borderline cardiomegaly, accentuated by technique. No pleural effusion or pneumothorax. Left costophrenic angle is minimally excluded. No congestive failure.  Clear lungs.  IMPRESSION: No acute cardiopulmonary disease.   Original Report Authenticated By: Jeronimo Greaves, M.D.    Dg Hip Portable 1 View Right  09/24/2012  *RADIOLOGY REPORT*  Clinical Data: Right hip replacement, postop  PORTABLE RIGHT HIP - 1 VIEW  Comparison: Portable exam 1533 hours compared to 09/23/2012  Findings: Acetabular and femoral components of hip prosthesis are identified. No acute fracture or dislocation. Bones appear demineralized.  IMPRESSION: Right hip prosthesis without acute abnormalities.   Original Report Authenticated By: Ulyses Southward, M.D.    Dg C-arm 1-60 Min-no Report  09/24/2012  CLINICAL DATA: intra op   C-ARM 1-60 MINUTES  Fluoroscopy was utilized by the requesting physician.  No radiographic  interpretation.      Scheduled Meds: . aspirin EC  325 mg Oral BID  . ferrous sulfate  325 mg Oral TID PC  . insulin aspart  0-15 Units Subcutaneous TID WC  . insulin aspart  0-5 Units Subcutaneous QHS  . lisinopril  20 mg Oral QHS  . metoprolol tartrate  25 mg Oral Daily  . nicotine  21 mg Transdermal Daily  . simvastatin  10 mg Oral QHS   Continuous Infusions: . sodium chloride 100 mL/hr at 09/25/12 1427    Principal Problem:   Intertrochanteric fracture of right hip Active Problems:   Type II or unspecified type diabetes mellitus without mention of complication, not stated as uncontrolled   HTN (hypertension)    Time spent: 30  min    Sameria Morss  [facility] Pager [phone]. If 7PM-7AM, please contact night-coverage at [email or web address], password [facility] 09/25/2012, 4:43 PM  LOS: 2 days

## 2012-09-25 NOTE — Progress Notes (Signed)
Utilization review completed.  

## 2012-09-25 NOTE — Progress Notes (Signed)
Physical Therapy Treatment Patient Details Name: Brady [name] MRN: [id number] DOB: [birth year] Today's Date: 09/25/2012 Time: 4098-1191 PT Time Calculation (min): 25 min  PT Assessment / Plan / Recommendation Comments on Treatment Session  Pt declined OOB this afternoon due to fever and not feeling well, but did agree to perform bed exercises.     Follow Up Recommendations  Home health PT;Supervision for mobility/OOB     Does the patient have the potential to tolerate intense rehabilitation     Barriers to Discharge        Equipment Recommendations  Rolling walker with 5" wheels    Recommendations for Other Services    Frequency 7X/week   Plan Discharge plan remains appropriate    Precautions / Restrictions Precautions Precautions: None Restrictions Weight Bearing Restrictions: No RLE Weight Bearing: Weight bearing as tolerated   Pertinent Vitals/Pain 4/10 pain with exercises.      Mobility  Bed Mobility Bed Mobility: Not assessed Transfers Transfers: Not assessed Ambulation/Gait Ambulation/Gait Assistance: Not tested (comment)    Exercises Total Joint Exercises Ankle Circles/Pumps: AROM;Both;20 reps Quad Sets: AROM;Right;10 reps Short Arc QuadBarbaraann [name];Right;10 reps Heel Slides: AAROM;Right;10 reps Hip ABduction/ADduction: AAROM;Right;10 reps   PT Diagnosis:    PT Problem List:   PT Treatment Interventions:     PT Goals Acute Rehab PT Goals PT Goal Formulation: With patient Time For Goal Achievement: 09/28/12 Potential to Achieve Goals: Good Pt will Perform Home Exercise Program: with supervision, verbal cues required/provided PT Goal: Perform Home Exercise Program - Progress: Progressing toward goal  Visit Information  Last PT Received On: 09/25/12 Assistance Needed: +1    Subjective Data  Subjective: I just can't walk this afternoon.  I have a fever.  Patient Stated Goal: to return home with sister.    Cognition  Cognition Arousal/Alertness:  Awake/alert Behavior During Therapy: WFL for tasks assessed/performed Overall Cognitive Status: Within Functional Limits for tasks assessed    Balance     End of Session PT - End of Session Activity Tolerance: Patient tolerated treatment well Patient left: in bed;with call bell/phone within reach;with family/visitor present Nurse Communication: Mobility status   GP     Vista Deck 09/25/2012, 5:20 PM

## 2012-09-25 NOTE — Evaluation (Signed)
Physical Therapy Evaluation Patient Details Name: Brady [name] MRN: [id number] DOB: 03-30-1956 Today's Date: 09/25/2012 Time: 4098-1191 PT Time Calculation (min): 30 min  PT Assessment / Plan / Recommendation Clinical Impression  Pt presents with R intertrochanteric femur fracture s/p THA (direct ant) POD 1 with decreased strength, ROM and mobility.  Tolerated OOB and ambulation in hallway well with RW at min assist, however does need continuous cues due to pt is easily distractable.  Pt will benefit from skilled PT in acute venue to address deficits.  PT recommends HHPT for follow up at D/C to maximize pts safety and function.     PT Assessment  Patient needs continued PT services    Follow Up Recommendations  Home health PT;Supervision for mobility/OOB    Does the patient have the potential to tolerate intense rehabilitation      Barriers to Discharge None      Equipment Recommendations  Rolling walker with 5" wheels    Recommendations for Other Services OT consult   Frequency 7X/week    Precautions / Restrictions Precautions Precautions: None Restrictions Weight Bearing Restrictions: No RLE Weight Bearing: Weight bearing as tolerated   Pertinent Vitals/Pain 3/10 ice packs applied.       Mobility  Bed Mobility Bed Mobility: Supine to Sit Supine to Sit: 5: Supervision;HOB flat Details for Bed Mobility Assistance: Pt insistent on getting himself out of bed on his own and was able to do it with increased time and cues for hand placement to self assist with HOB flat.  Transfers Transfers: Sit to Stand;Stand to Sit Sit to Stand: 4: Min guard;From elevated surface;With upper extremity assist;From bed Stand to Sit: 4: Min guard;With upper extremity assist;With armrests;To chair/3-in-1 Details for Transfer Assistance: Min/gaurd for safety with min cues for hand placement and LE management.  Ambulation/Gait Ambulation/Gait Assistance: 4: Min assist Ambulation Distance  (Feet): 45 Feet Assistive device: Rolling walker Ambulation/Gait Assistance Details: Cues for sequencing/technique with RW throughout amb as pt is easily distractable during session.   Gait Pattern: Step-to pattern;Decreased stride length;Antalgic Gait velocity: very slow Stairs: No Wheelchair Mobility Wheelchair Mobility: No    Exercises     PT Diagnosis: Difficulty walking;Generalized weakness;Acute pain  PT Problem List: Decreased strength;Decreased range of motion;Decreased activity tolerance;Decreased balance;Decreased mobility;Decreased coordination;Decreased knowledge of use of DME;Decreased safety awareness;Decreased knowledge of precautions;Pain PT Treatment Interventions: DME instruction;Gait training;Stair training;Functional mobility training;Therapeutic activities;Therapeutic exercise;Balance training;Patient/family education   PT Goals Acute Rehab PT Goals PT Goal Formulation: With patient Time For Goal Achievement: 09/28/12 Potential to Achieve Goals: Good Pt will go Supine/Side to Sit: with supervision PT Goal: Supine/Side to Sit - Progress: Goal set today Pt will go Sit to Supine/Side: with supervision PT Goal: Sit to Supine/Side - Progress: Goal set today Pt will go Sit to Stand: with supervision PT Goal: Sit to Stand - Progress: Goal set today Pt will Ambulate: 51 - 150 feet;with supervision;with least restrictive assistive device PT Goal: Ambulate - Progress: Goal set today Pt will Go Up / Down Stairs: 1-2 stairs;with min assist;with least restrictive assistive device PT Goal: Up/Down Stairs - Progress: Goal set today Pt will Perform Home Exercise Program: with supervision, verbal cues required/provided PT Goal: Perform Home Exercise Program - Progress: Goal set today  Visit Information  Last PT Received On: 09/25/12 Assistance Needed: +1    Subjective Data  Subjective: I want to impress you.  Patient Stated Goal: to return home with sister.    Prior  Functioning  Home Living Lives With:  Family (sister) Available Help at Discharge: Family;Available 24 hours/day Type of Home: House Home Access: Stairs to enter [employer] of Steps: 1 Entrance Stairs-Rails: None Home Layout: One level Bathroom Shower/Tub: Walk-in shower;Door Dentist: None Prior Function Level of Independence: Independent Able to Take Stairs?: Yes Driving: Yes Vocation: Retired Musician: No difficulties    Copywriter, advertising Arousal/Alertness: Awake/alert Behavior During Therapy: WFL for tasks assessed/performed Overall Cognitive Status: Within Functional Limits for tasks assessed    Extremity/Trunk Assessment Right Lower Extremity Assessment RLE ROM/Strength/Tone: Deficits RLE ROM/Strength/Tone Deficits: ankle motions WFL, hip add 2/5 RLE Sensation: WFL - Light Touch Left Lower Extremity Assessment LLE ROM/Strength/Tone: WFL for tasks assessed Trunk Assessment Trunk Assessment: Normal   Balance    End of Session PT - End of Session Equipment Utilized During Treatment: Gait belt Activity Tolerance: Patient tolerated treatment well Patient left: in chair;with call bell/phone within reach Nurse Communication: Mobility status  GP     Vista Deck 09/25/2012, 1:19 PM

## 2012-09-26 LAB — BASIC METABOLIC PANEL
GFR calc Af Amer: >90 mL/min (ref >90)
GFR calc non Af Amer: >90 mL/min (ref >90)
Potassium: 3.6 mEq/L (ref 3.5–5.1)
Sodium: 135 mEq/L (ref 135–145)

## 2012-09-26 LAB — URINE CULTURE: Colony Count: NO GROWTH

## 2012-09-26 LAB — CBC
MCHC: 34.4 g/dL (ref 30.0–36.0)
Platelets: 171 10*3/uL (ref 150–400)
RDW: 12.8 % (ref 11.5–15.5)

## 2012-09-26 LAB — GLUCOSE, CAPILLARY: Glucose-Capillary: 143 mg/dL — ABNORMAL HIGH (ref 70–99)

## 2012-09-26 MED ORDER — ASPIRIN 325 MG PO TBEC: 325.0000 mg | DELAYED_RELEASE_TABLET | Freq: Two times a day (BID) | ORAL | Status: AC

## 2012-09-26 MED ORDER — NICOTINE 21 MG/24HR TD PT24: 28.0000 | MEDICATED_PATCH | Freq: Every day | TRANSDERMAL | Status: DC

## 2012-09-26 MED ORDER — FERROUS SULFATE 325 (65 FE) MG PO TABS: 325.0000 mg | ORAL_TABLET | Freq: Three times a day (TID) | ORAL | Status: AC

## 2012-09-26 NOTE — Care Management Note (Signed)
    Page 1 of 2   09/26/2012     10:17:40 AM   CARE MANAGEMENT NOTE 09/26/2012  Patient:  Brady [name], Brady [name]   Account Number:  [id number]  Date Initiated:  09/26/2012  Documentation initiated by:  Colleen Can  Subjective/Objective Assessment:   DX RT FEMORAL NECK FRACTURE; RT TOTAL HIP REPALACEMNT-ANTERIOR APPROACH     Action/Plan:   CM spoke with patient. Plans are for patient to return to his single level home in [facility] States his sister is staying with him and will be caregiver. He wants HH agency that is in network. Needs RW   Anticipated DC Date:  09/26/2012   Anticipated DC Plan:  HOME W HOME HEALTH SERVICES      DC Planning Services  CM consult      PAC Choice  DURABLE MEDICAL EQUIPMENT  HOME HEALTH   Choice offered to / List presented to:  C-1 Patient   DME arranged  Levan Hurst      DME agency  [facility]     HH arranged  HH-2 PT      [facility] agency  [facility]   Status of service:  Completed, signed off Medicare Important Message given?  NO (If response is "NO", the following Medicare IM given date fields will be blank) Date Medicare IM given:   Date Additional Medicare IM given:    Discharge Disposition:  HOME W HOME HEALTH SERVICES  Per UR Regulation:    If discussed at Long Length of Stay Meetings, dates discussed:    Comments:  09/26/2012 Colleen Can BSN RN CCM [phone] [facility] rep called and she advised that they could provide HHpt services with start date of tomorrow. Pt advised and given contact information.

## 2012-09-26 NOTE — Progress Notes (Signed)
Physical Therapy Treatment Patient Details Name: Brady [name] MRN: [id number] DOB: Jul 29, 1955 Today's Date: 09/26/2012 Time: 7829-5621 PT Time Calculation (min): 47 min  PT Assessment / Plan / Recommendation Comments on Treatment Session  Pt progressing, however with very slow gait speed.  Will see for one more session and then D/C.     Follow Up Recommendations  Home health PT;Supervision for mobility/OOB     Does the patient have the potential to tolerate intense rehabilitation     Barriers to Discharge        Equipment Recommendations  Rolling walker with 5" wheels    Recommendations for Other Services    Frequency 7X/week   Plan Discharge plan remains appropriate    Precautions / Restrictions Precautions Precautions: None Restrictions Weight Bearing Restrictions: No RLE Weight Bearing: Weight bearing as tolerated   Pertinent Vitals/Pain 5/10 pain, esp when advancing RLE    Mobility  Bed Mobility Bed Mobility: Supine to Sit Supine to Sit: 5: Supervision;HOB flat Details for Bed Mobility Assistance: Requires increased time to complete task.  Provided cues for using LLE to assist R, however preferred to do it on his own.  Transfers Transfers: Sit to Stand;Stand to Sit Sit to Stand: With upper extremity assist;From bed;6: Modified independent (Device/Increase time) Stand to Sit: With armrests;To chair/3-in-1;6: Modified independent (Device/Increase time) Details for Transfer Assistance: increased time Ambulation/Gait Ambulation/Gait Assistance: 5: Supervision Ambulation Distance (Feet): 80 Feet Assistive device: Rolling walker Ambulation/Gait Assistance Details: Continues to require intermittent cues for sequencing/technique with RW and has VERY slow gait speed.  Unable to fully advance RLE and has to "toe walk" forward on most steps.  Gait Pattern: Step-to pattern;Decreased stride length;Antalgic Gait velocity: very slow    Exercises Total Joint  Exercises Ankle Circles/Pumps: AROM;Both;20 reps Quad Sets: AROM;Right;10 reps Short Arc QuadBarbaraann Boys;Right;10 reps Heel Slides: AAROM;Right;10 reps Hip ABduction/ADduction: AAROM;Right;10 reps   PT Diagnosis:    PT Problem List:   PT Treatment Interventions:     PT Goals Acute Rehab PT Goals PT Goal Formulation: With patient Time For Goal Achievement: 09/28/12 Potential to Achieve Goals: Good Pt will go Supine/Side to Sit: with supervision PT Goal: Supine/Side to Sit - Progress: Met Pt will go Sit to Stand: with supervision PT Goal: Sit to Stand - Progress: Met Pt will Ambulate: 51 - 150 feet;with supervision;with least restrictive assistive device PT Goal: Ambulate - Progress: Met Pt will Go Up / Down Stairs: 1-2 stairs;with min assist;with least restrictive assistive device PT Goal: Up/Down Stairs - Progress: Met Pt will Perform Home Exercise Program: with supervision, verbal cues required/provided PT Goal: Perform Home Exercise Program - Progress: Met  Visit Information  Last PT Received On: 09/26/12 Assistance Needed: +1    Subjective Data  Subjective: I'm ready for you.  Patient Stated Goal: to return home with sister.    Cognition  Cognition Arousal/Alertness: Awake/alert Behavior During Therapy: WFL for tasks assessed/performed Overall Cognitive Status: Within Functional Limits for tasks assessed    Balance  Balance Balance Assessed: Yes Dynamic Standing Balance Dynamic Standing - Balance Support: During functional activity Dynamic Standing - Level of Assistance: 4: Min assist Dynamic Standing - Balance Activities: Other (comment) (shower transfers)  End of Session PT - End of Session Activity Tolerance: Patient tolerated treatment well Patient left: in chair;with call bell/phone within reach Nurse Communication: Mobility status   GP     Vista Deck 09/26/2012, 3:04 PM

## 2012-09-26 NOTE — Progress Notes (Signed)
   Subjective: 2 Days Post-Op Procedure(s) (LRB): TOTAL HIP ARTHROPLASTY ANTERIOR APPROACH (Right)   Patient reports pain as mild, pain in the hip is well controlled. Complaining of some muscle spasms, but over all the pain is controlled. No events throughout the night. Ready to be discharged home if ready medically.  Objective:   VITALS:   Filed Vitals:   09/26/12  BP: 121/81  Pulse: 94  Temp: 98.4 F (36.9 C)   Resp: 18    Neurovascular intact Dorsiflexion/Plantar flexion intact Incision: dressing C/D/I No cellulitis present Compartment soft  LABS  Recent Labs  09/23/12 1725 09/25/12 0449 09/26/12 0443  HGB 15.3 13.3 12.5*  HCT 42.9 37.9* 36.3*  WBC 16.5* 13.2* 10.0  PLT 226 174 171     Recent Labs  09/23/12 1725 09/25/12 0449 09/26/12 0443  NA 135 131* 135  K 3.9 3.8 3.6  BUN 10 9 8   CREATININE 0.65 0.64 0.56  GLUCOSE 184* 205* 155*     Assessment/Plan: 2 Days Post-Op Procedure(s) (LRB): TOTAL HIP ARTHROPLASTY ANTERIOR APPROACH (Right) Up with therapy Discharge home with home health when ready medically Orthopaedically stable Norco for pain, Rx written  Robaxin for muscle spasms, Rx written  ASA bid for 4 weeks for anticoagulation  WBAT on the right leg  Follow up in 2 weeks at [facility].     Follow up with OLIN,Lynley Killilea D in 2 weeks.      Contact information:   [facility]     [address]     [place] [address]     [phone]       Anastasio Auerbach. Willean Schurman   PAC  09/26/2012, 9:33 AM
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
## 2012-09-26 NOTE — Evaluation (Signed)
Occupational Therapy Evaluation and Discharge Summary Patient Details Name: Brady [name] MRN: [id number] DOB: 10-Jun-1955 Today's Date: 09/26/2012 Time: 4098-1191 OT Time Calculation (min): 20 min  OT Assessment / Plan / Recommendation Clinical Impression  Pt is a 57 yo male admitted for R hip fracture who is now doing fairly well with adls and will have sister home to assist after d/c.  All OT ed complete.      OT Assessment  Patient does not need any further OT services    Follow Up Recommendations  No OT follow up    Barriers to Discharge      Equipment Recommendations  None recommended by OT    Recommendations for Other Services    Frequency       Precautions / Restrictions Precautions Precautions: None Restrictions Weight Bearing Restrictions: No RLE Weight Bearing: Weight bearing as tolerated   Pertinent Vitals/Pain Pt with 4/10 pain.    ADL  Eating/Feeding: Performed;Independent Where Assessed - Eating/Feeding: Chair Grooming: Performed;Wash/dry hands;Supervision/safety Where Assessed - Grooming: Supported standing Upper Body Bathing: Simulated;Set up Where Assessed - Upper Body Bathing: Unsupported sitting Lower Body Bathing: Simulated;Minimal assistance Where Assessed - Lower Body Bathing: Supported sit to stand Upper Body Dressing: Simulated;Set up Where Assessed - Upper Body Dressing: Unsupported sitting Lower Body Dressing: Performed;Minimal assistance Where Assessed - Lower Body Dressing: Supported sit to stand Toilet Transfer: Performed;Supervision/safety Toilet Transfer Method: Other (comment);Sit to stand (ambulated to br) Toilet Transfer Equipment: Comfort height toilet;Grab bars Toileting - Clothing Manipulation and Hygiene: Performed;Supervision/safety Where Assessed - Toileting Clothing Manipulation and Hygiene: Sit to stand from 3-in-1 or toilet Tub/Shower Transfer: Performed;Min guard Tub/Shower Transfer Method: Engineer, materials: Walk in shower Equipment Used: Rolling walker Transfers/Ambulation Related to ADLs: Pt walked in room and to bathroom with walker.  Pt was very safe with all transfers. ADL Comments: Pt needs assist getting to R foot only for sock and shoes.  Otherwise, pt fairly I with basic adls.    OT Diagnosis:    OT Problem List:   OT Treatment Interventions:     OT Goals    Visit Information  Last OT Received On: 09/26/12 Assistance Needed: +1    Subjective Data  Subjective: "I am going home today." Patient Stated Goal: to go home   Prior Functioning     Home Living Lives With: Family Available Help at Discharge: Family;Available 24 hours/day Type of Home: House Home Access: Stairs to enter [employer] of Steps: 1 Entrance Stairs-Rails: None Home Layout: One level Bathroom Shower/Tub: Walk-in shower;Door [employer] Toilet: Standard Bathroom Accessibility: Yes How Accessible: Accessible via walker Home Adaptive Equipment: Bedside commode/3-in-1 Prior Function Level of Independence: Independent Able to Take Stairs?: Yes Driving: Yes Vocation: Retired Musician: No difficulties Dominant Hand: Right         Vision/Perception Vision - History Baseline Vision: No visual deficits Patient Visual Report: No change from baseline Vision - Assessment Vision Assessment: Vision not tested   [employer] Arousal/Alertness: Awake/alert Behavior During Therapy: WFL for tasks assessed/performed Overall Cognitive Status: Within Functional Limits for tasks assessed    Extremity/Trunk Assessment Right Upper Extremity Assessment RUE ROM/Strength/Tone: Within functional levels RUE Sensation: WFL - Light Touch RUE Coordination: WFL - gross/fine motor Left Upper Extremity Assessment LUE ROM/Strength/Tone: Within functional levels LUE Sensation: WFL - Light Touch LUE Coordination: WFL - gross/fine motor Trunk Assessment Trunk  Assessment: Normal     Mobility Bed Mobility Bed Mobility: Not assessed Transfers Transfers: Sit to Stand;Stand to  Sit Sit to Stand: 5: Supervision;From chair/3-in-1;With armrests Stand to Sit: 5: Supervision;To chair/3-in-1;With armrests Details for Transfer Assistance: Pt was safe with all hand placement and all transfers.     Exercise     Balance Balance Balance Assessed: Yes Dynamic Standing Balance Dynamic Standing - Balance Support: During functional activity Dynamic Standing - Level of Assistance: 4: Min assist Dynamic Standing - Balance Activities: Other (comment) (shower transfers)   End of Session OT - End of Session Activity Tolerance: Patient tolerated treatment well Patient left: in chair;with call bell/phone within reach Nurse Communication: Mobility status  GO     Hope Budds 09/26/2012, 12:27 PM [phone]

## 2012-09-26 NOTE — Progress Notes (Signed)
Physical Therapy Treatment Patient Details Name: Brady [name] MRN: [id number] DOB: Oct 08, 1955 Today's Date: 09/26/2012 Time: 7829-5621 PT Time Calculation (min): 37 min  PT Assessment / Plan / Recommendation Comments on Treatment Session  Pt progressing, however with very slow gait speed.  Will see for one more session and then D/C.     Follow Up Recommendations  Home health PT;Supervision for mobility/OOB     Does the patient have the potential to tolerate intense rehabilitation     Barriers to Discharge        Equipment Recommendations  Rolling walker with 5" wheels    Recommendations for Other Services    Frequency 7X/week   Plan Discharge plan remains appropriate    Precautions / Restrictions Precautions Precautions: None Restrictions Weight Bearing Restrictions: No RLE Weight Bearing: Weight bearing as tolerated   Pertinent Vitals/Pain 5/10 with ambulation, ice pack applied    Mobility  Bed Mobility Bed Mobility: Supine to Sit Supine to Sit: 5: Supervision;HOB flat Details for Bed Mobility Assistance: Requires increased time to complete task.  Provided cues for using LLE to assist R, however preferred to do it on his own.  Transfers Transfers: Sit to Stand;Stand to Sit Sit to Stand: 5: Supervision;With upper extremity assist;From bed Stand to Sit: 5: Supervision;With armrests;To chair/3-in-1 Details for Transfer Assistance: Supervision for safety, doing well with hand placement with single cue.  Ambulation/Gait Ambulation/Gait Assistance: 5: Supervision Ambulation Distance (Feet): 80 Feet Assistive device: Rolling walker Ambulation/Gait Assistance Details: continue to provide cues for sequencing/technique with RW.  Again, pt very easily distracted and redirected to task.  Gait Pattern: Step-to pattern;Decreased stride length;Antalgic Gait velocity: very slow    Exercises     PT Diagnosis:    PT Problem List:   PT Treatment Interventions:     PT  Goals Acute Rehab PT Goals PT Goal Formulation: With patient Time For Goal Achievement: 09/28/12 Potential to Achieve Goals: Good Pt will go Supine/Side to Sit: with supervision PT Goal: Supine/Side to Sit - Progress: Met Pt will go Sit to Stand: with supervision PT Goal: Sit to Stand - Progress: Met Pt will Ambulate: 51 - 150 feet;with supervision;with least restrictive assistive device PT Goal: Ambulate - Progress: Met  Visit Information  Last PT Received On: 09/26/12 Assistance Needed: +1    Subjective Data  Subjective: I'm ready for you.  Patient Stated Goal: to return home with sister.    Cognition  Cognition Arousal/Alertness: Awake/alert Behavior During Therapy: WFL for tasks assessed/performed Overall Cognitive Status: Within Functional Limits for tasks assessed    Balance  Balance Balance Assessed: Yes Dynamic Standing Balance Dynamic Standing - Balance Support: During functional activity Dynamic Standing - Level of Assistance: 4: Min assist Dynamic Standing - Balance Activities: Other (comment) (shower transfers)  End of Session PT - End of Session Activity Tolerance: Patient tolerated treatment well Patient left: in chair;with call bell/phone within reach Nurse Communication: Mobility status   GP     Vista Deck 09/26/2012, 1:20 PM

## 2012-09-27 ENCOUNTER — Other Ambulatory Visit (HOSPITAL_COMMUNITY): Payer: Self-pay

## 2012-09-27 DIAGNOSIS — G4730 Sleep apnea, unspecified: Secondary | ICD-10-CM

## 2012-09-27 NOTE — Progress Notes (Signed)
Discharge summary sent to payer through [facility]  

## 2012-10-13 ENCOUNTER — Emergency Department (HOSPITAL_COMMUNITY)
Admission: EM | Admit: 2012-10-13 | Discharge: 2012-10-13 | Disposition: A | Discharge status: Home / Self Care | Payer: BC Managed Care – PPO | Attending: Emergency Medicine | Admitting: Emergency Medicine

## 2012-10-13 ENCOUNTER — Encounter (HOSPITAL_COMMUNITY): Payer: Self-pay | Admitting: Emergency Medicine

## 2012-10-13 DIAGNOSIS — R109 Unspecified abdominal pain: Secondary | ICD-10-CM | POA: Insufficient documentation

## 2012-10-13 DIAGNOSIS — Z79899 Other long term (current) drug therapy: Secondary | ICD-10-CM | POA: Insufficient documentation

## 2012-10-13 DIAGNOSIS — K921 Melena: Secondary | ICD-10-CM | POA: Insufficient documentation

## 2012-10-13 DIAGNOSIS — K625 Hemorrhage of anus and rectum: Secondary | ICD-10-CM | POA: Insufficient documentation

## 2012-10-13 DIAGNOSIS — F17200 Nicotine dependence, unspecified, uncomplicated: Secondary | ICD-10-CM | POA: Insufficient documentation

## 2012-10-13 DIAGNOSIS — E119 Type 2 diabetes mellitus without complications: Secondary | ICD-10-CM | POA: Insufficient documentation

## 2012-10-13 DIAGNOSIS — G4730 Sleep apnea, unspecified: Secondary | ICD-10-CM | POA: Insufficient documentation

## 2012-10-13 DIAGNOSIS — I10 Essential (primary) hypertension: Secondary | ICD-10-CM | POA: Insufficient documentation

## 2012-10-13 LAB — CBC WITH DIFFERENTIAL/PLATELET
Basophils Absolute: 0 10*3/uL (ref 0.0–0.1)
Basophils Relative: 0 % (ref 0–1)
Eosinophils Absolute: 0.1 10*3/uL (ref 0.0–0.7)
Eosinophils Relative: 1 % (ref 0–5)
MCH: 30.9 pg (ref 26.0–34.0)
MCV: 89.6 fL (ref 78.0–100.0)
Platelets: 471 10*3/uL — ABNORMAL HIGH (ref 150–400)
RDW: 12.6 % (ref 11.5–15.5)

## 2012-10-13 LAB — COMPREHENSIVE METABOLIC PANEL
ALT: 28 U/L (ref 0–53)
AST: 20 U/L (ref 0–37)
Albumin: 3.7 g/dL (ref 3.5–5.2)
Calcium: 9.7 mg/dL (ref 8.4–10.5)
GFR calc Af Amer: >90 mL/min (ref >90)
Sodium: 134 mEq/L — ABNORMAL LOW (ref 135–145)
Total Protein: 7 g/dL (ref 6.0–8.3)

## 2012-10-13 LAB — OCCULT BLOOD, POC DEVICE: Fecal Occult Bld: POSITIVE — AB

## 2012-10-13 NOTE — ED Provider Notes (Signed)
History     CSN: [id number]  Arrival date & time 10/13/12  1251   First MD Initiated Contact with Patient 10/13/12 1317      Chief Complaint  Patient presents with  . Rectal Bleeding    (Consider location/radiation/quality/duration/timing/severity/associated sxs/prior treatment) HPI Comments: Patient with history of recent hip replacement 3 weeks ago.  Is on aspirin.  He went to the bathroom yesterday and noted bright red blood from the rectum.  He denies having passed any hard stools or having to strain while defecating.  He does admit to several episodes of suprapubic cramping since the symptoms began but nothing that has been persistent.  Patient is a 57 y.o. Brady [name] presenting with hematochezia. The history is provided by the patient.  Rectal Bleeding  The current episode started yesterday. The onset was sudden. The problem occurs continuously. The problem has been unchanged. The patient is experiencing no pain. The stool is described as soft. There was no prior successful therapy. Pertinent negatives include no fever, no nausea, no rectal pain and no vomiting.    Past Medical History  Diagnosis Date  . Sleep apnea   . Diabetes mellitus without complication   . Hypertension     Past Surgical History  Procedure Laterality Date  . 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 No pertinent family history.  History  Substance Use Topics  . Smoking status: Current Every Day Smoker    Types: Cigarettes  . Smokeless tobacco: Not on file  . Alcohol Use: Yes     Comment: daily      Review of Systems  Constitutional: Negative for fever.  Gastrointestinal: Positive for hematochezia. Negative for nausea, vomiting and rectal pain.  All other systems reviewed and are negative.    Allergies  Review of patient's  allergies indicates no known allergies.  Home Medications   Current Outpatient Rx  Name  Route  Sig  Dispense  Refill  . aspirin 325 MG EC tablet   Oral   Take 1 tablet (325 mg total) by mouth 2 (two) times daily.   60 tablet   0     Take for 4 weeks, then return to your regular dosi ...   . ferrous sulfate 325 (65 FE) MG tablet   Oral   Take 1 tablet (325 mg total) by mouth 3 (three) times daily after meals.   90 tablet   0   . HYDROcodone-acetaminophen (NORCO/VICODIN) 5-325 MG per tablet   Oral   Take 1-2 tablets by mouth every 4 (four) hours as needed for pain.   120 tablet   0   . KRILL OIL PO   Oral   Take 2 capsules by mouth every morning.         Marland Kitchen lisinopril (PRINIVIL,ZESTRIL) 10 MG tablet   Oral   Take 20 mg by mouth at bedtime.          . metFORMIN (GLUCOPHAGE) 500 MG tablet   Oral   Take 1,000 mg by mouth 2 (two) times daily with a meal.         . methocarbamol (ROBAXIN) 500 MG tablet   Oral   Take 1 tablet (500 mg total) by mouth every 6 (six) hours as needed (muscle spasms).   50 tablet   0   . metoprolol tartrate (LOPRESSOR)  25 MG tablet   Oral   Take 25 mg by mouth daily.         . Multiple Vitamins-Minerals (CENTRUM SILVER PO)   Oral   Take 1 tablet by mouth every morning.         . nicotine (NICODERM CQ - DOSED IN MG/24 HOURS) 21 mg/24hr patch   Transdermal   Place 28 patches onto the skin daily.   28 patch   0   . simvastatin (ZOCOR) 10 MG tablet   Oral   Take 10 mg by mouth at bedtime.           BP 84/60  Pulse 106  Temp(Src) 98.7 F (37.1 C) (Oral)  Ht 5\' 9"  (1.753 m)  Wt 221 lb (100.245 kg)  BMI 32.62 kg/m2  SpO2 100%  Physical Exam  Nursing note and vitals reviewed. Constitutional: He is oriented to person, place, and time. He appears well-developed and well-nourished. No distress.  HENT:  Head: Normocephalic and atraumatic.  Mouth/Throat: Oropharynx is clear and moist.  Neck: Normal range of motion. Neck  supple.  Cardiovascular: Normal rate and regular rhythm.   No murmur heard. Pulmonary/Chest: Effort normal and breath sounds normal. No respiratory distress. He has no wheezes.  Abdominal: Soft. Bowel sounds are normal. He exhibits no distension. There is no tenderness.  Genitourinary: Rectum normal.  There is bloody loose stool noted in the rectum.  No melena.  No masses palpable or visible fissures.  Musculoskeletal: Normal range of motion.  Neurological: He is alert and oriented to person, place, and time.  Skin: Skin is warm and dry. He is not diaphoretic.    ED Course  Procedures (including critical care time)  Labs Reviewed  CBC WITH DIFFERENTIAL  COMPREHENSIVE METABOLIC PANEL  PROTIME-INR  OCCULT BLOOD X 1 CARD TO LAB, STOOL   No results found.   No diagnosis found.    MDM  The workup shows heme positive stool, wbc of 13, Hb 13.9.  He appears hemodynamically stable and the abdomen is benign.  I have discussed this case with Dr. Bosie Clos who feels as though he would be appropriate for outpatient follow to arrange a colonoscopy.          Brady Lyons, MD 10/13/12 [phone]

## 2012-10-13 NOTE — ED Notes (Signed)
Removed IV: Left - forearm - clean/dry/intact

## 2012-10-13 NOTE — ED Notes (Signed)
Hemmocult done by MD

## 2012-10-13 NOTE — ED Notes (Signed)
Patient states that he has hip total done about 3 weeks ago. He stopped his blood thinners on own yesterday when he started to pass blood in his stools. The patient reports that he has bright red blood in his stool. Since yesterday

## 2012-10-13 NOTE — ED Notes (Signed)
MD at bedside. 

## 2014-11-01 IMAGING — CR DG HIP COMPLETE 2+V*R*
4 series · 4 of 4 positions shown · non-contrast
Comparison: None

CLINICAL DATA: Fell.  Right leg pain.

RIGHT HIP - COMPLETE 2+ VIEW

[view not recorded (1 of 4)]
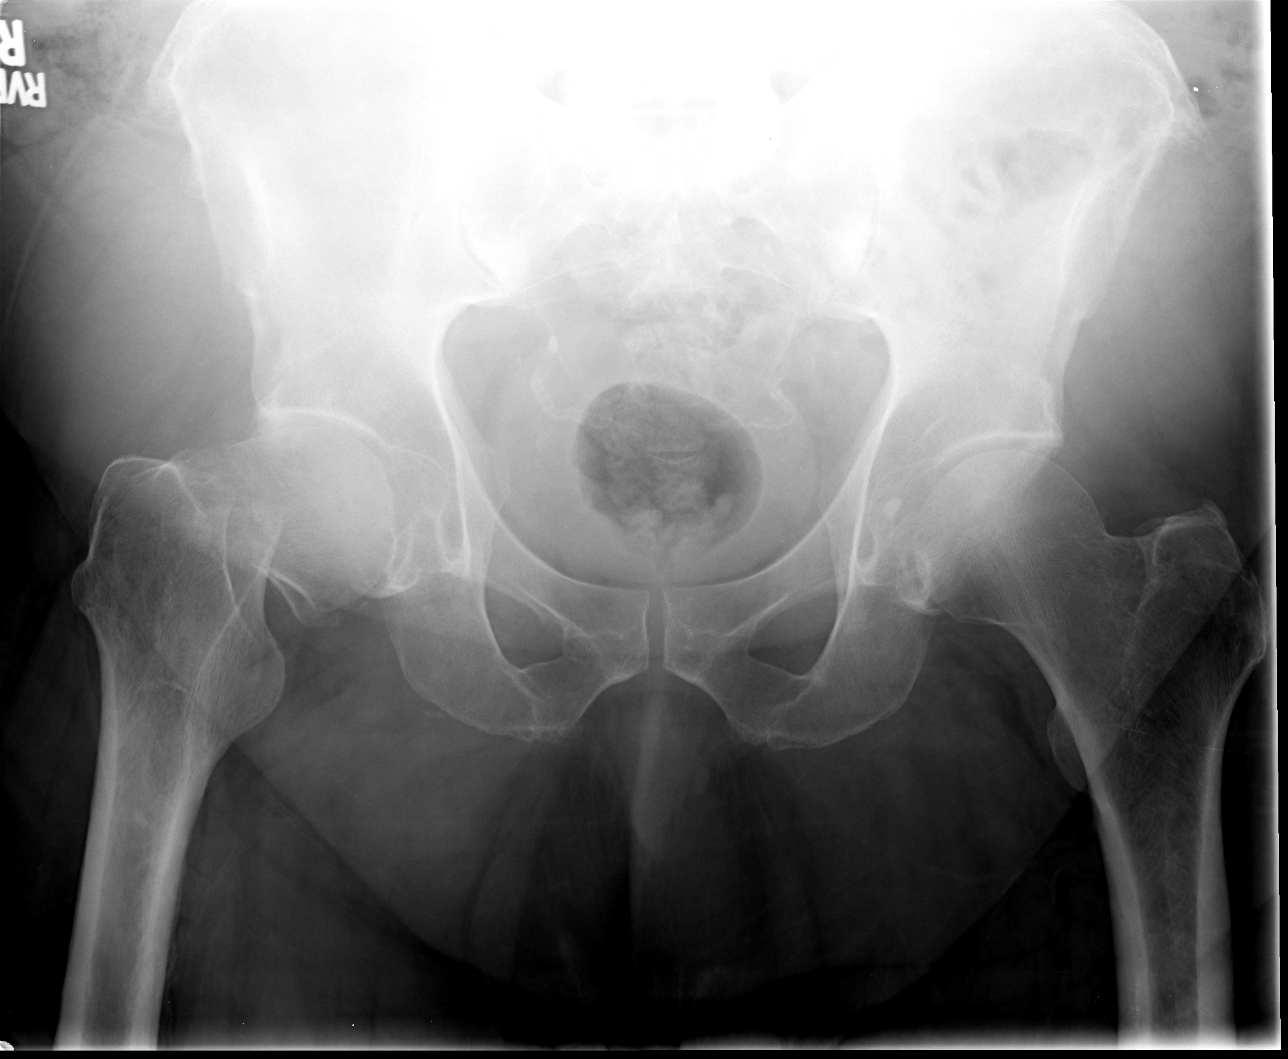

[view not recorded (2 of 4)]
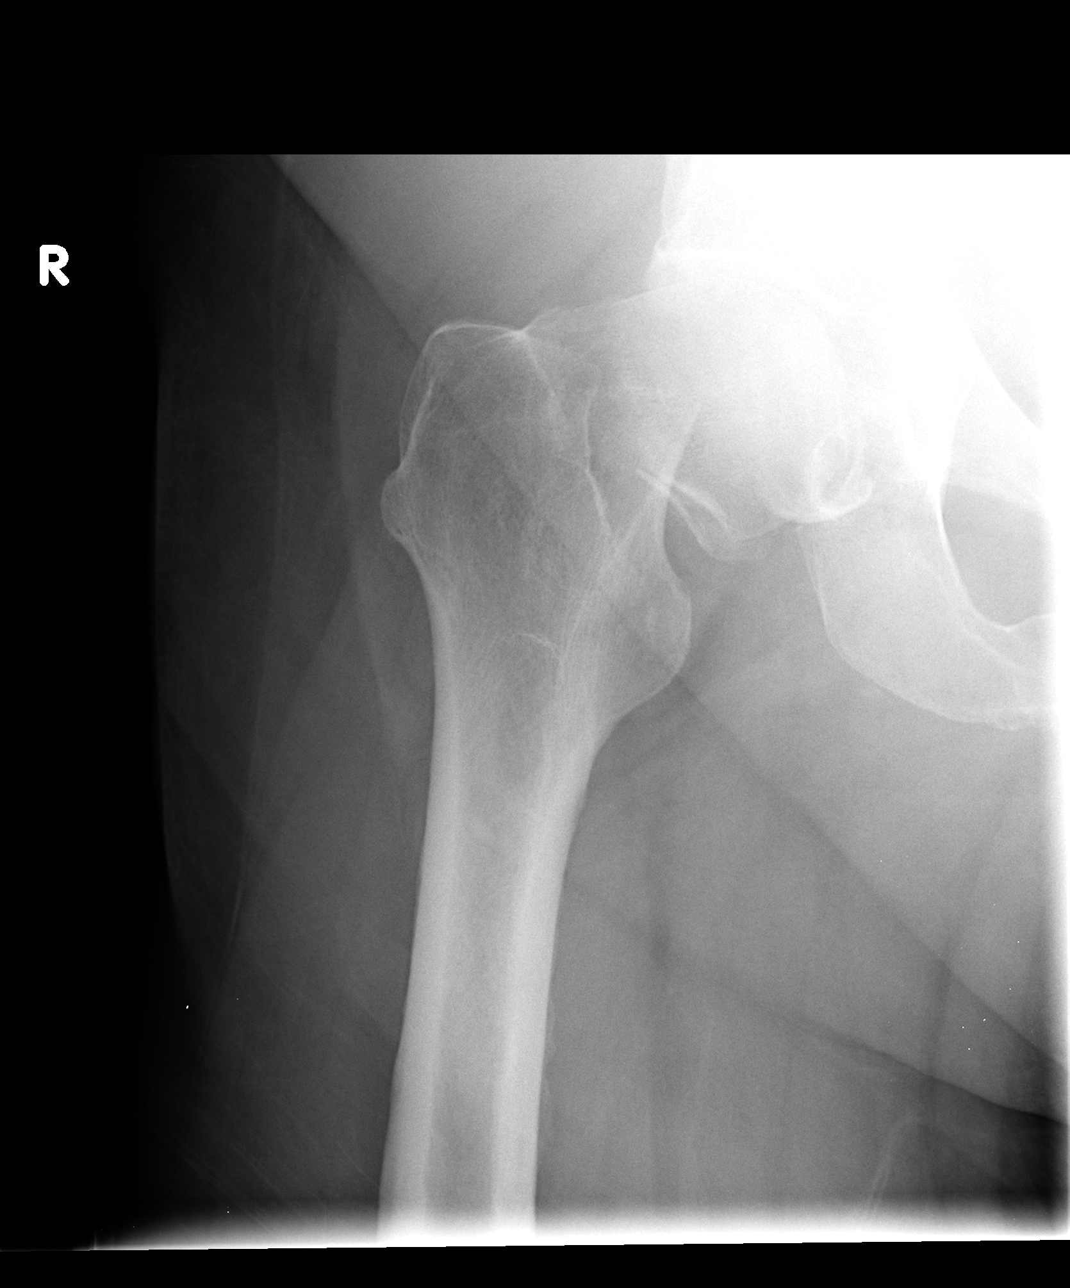

[view not recorded (3 of 4)]
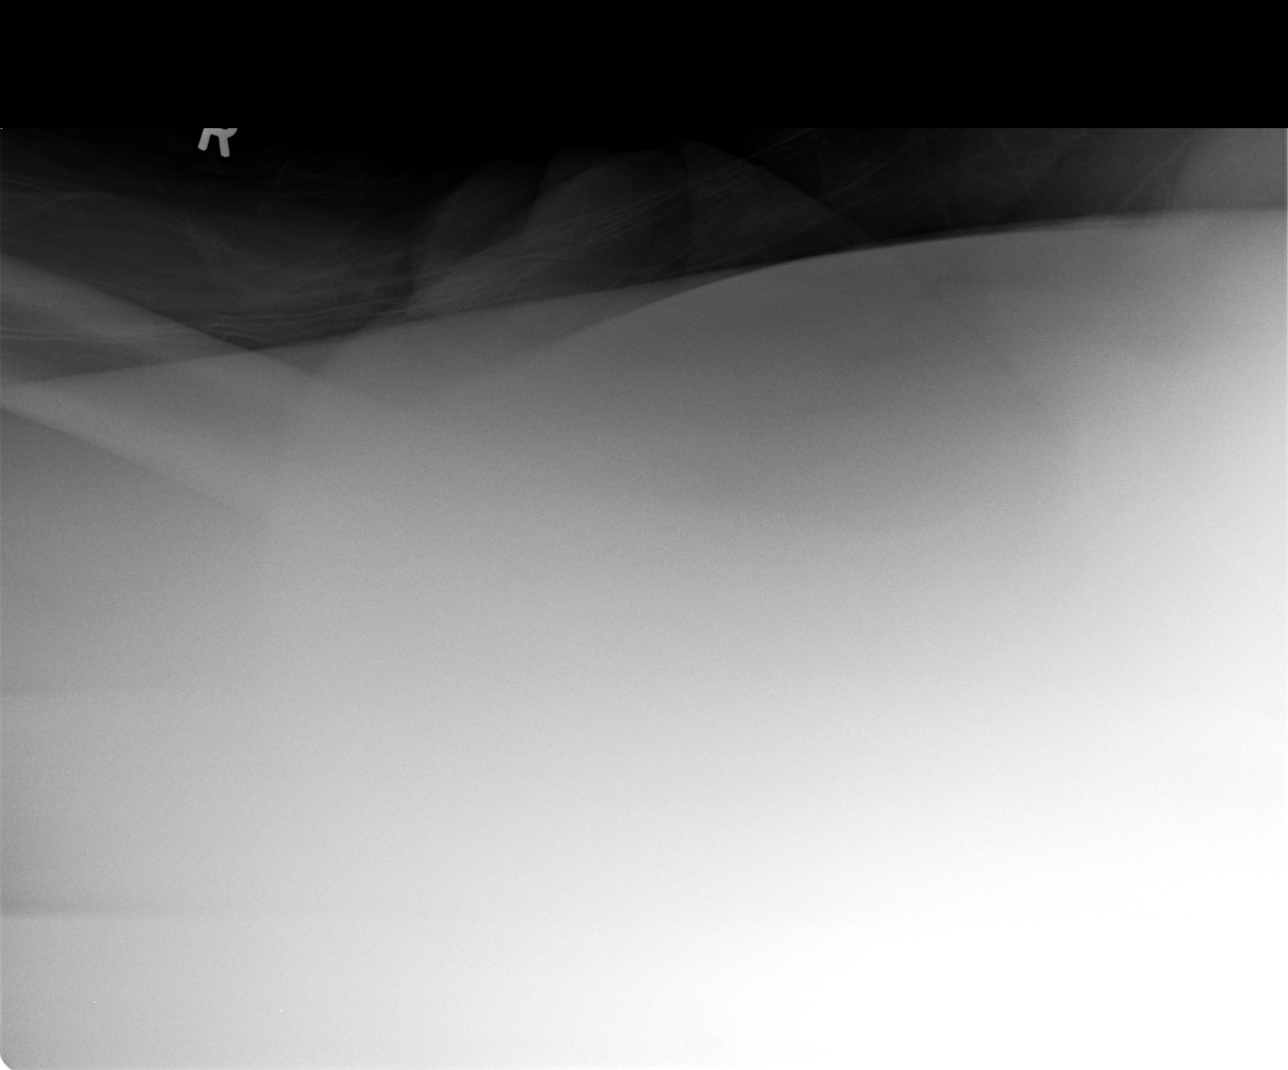

[view not recorded (4 of 4)]
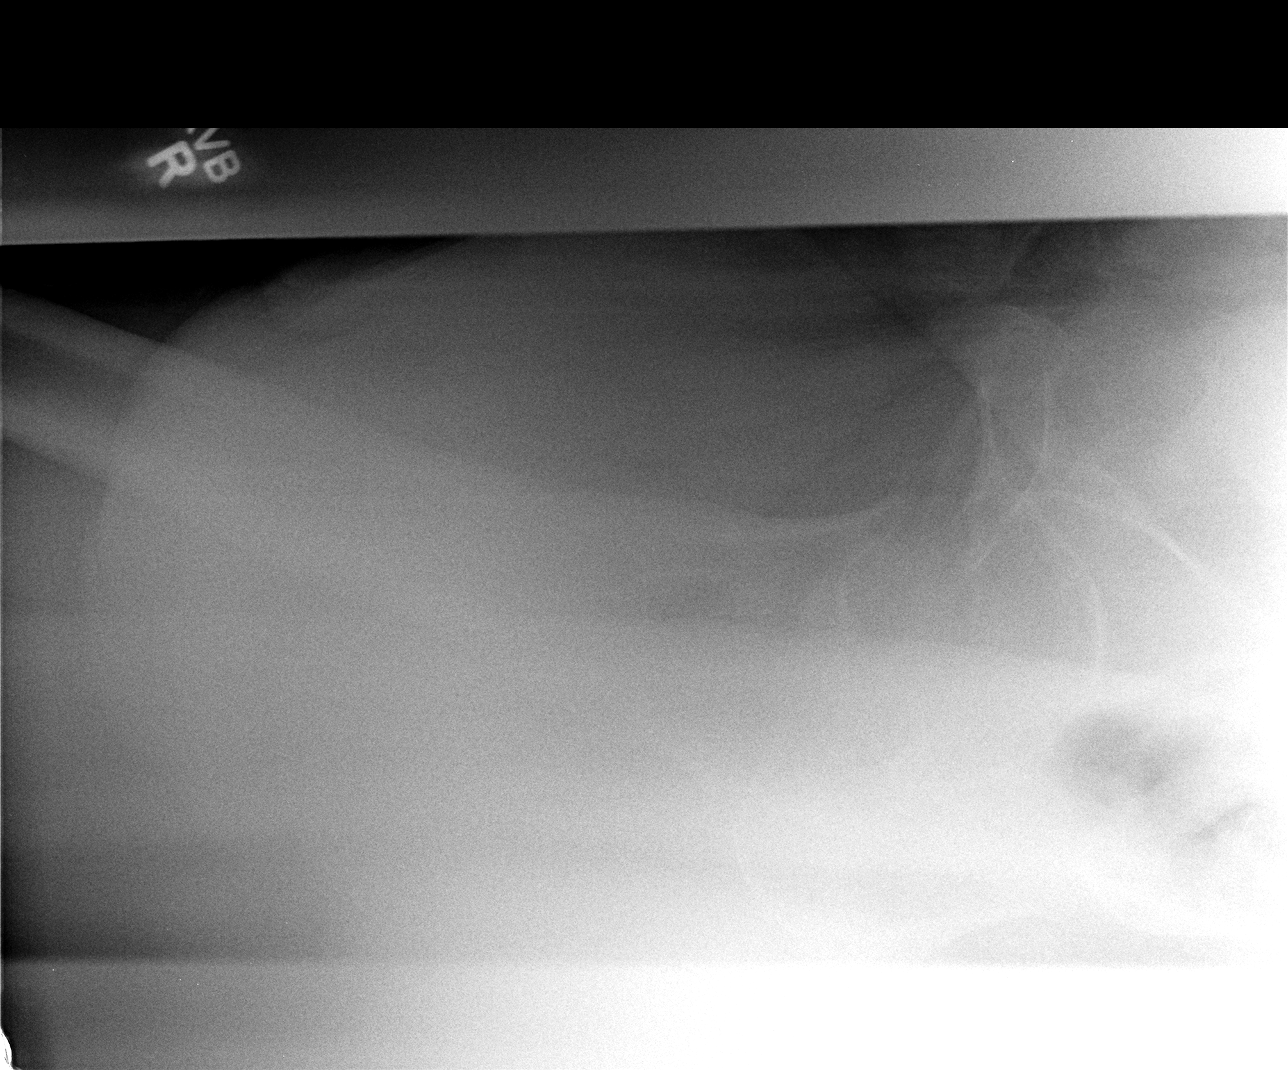

[4 of 4 positions shown; findings below may reference images not displayed]

FINDINGS: There is a displaced femoral neck fracture with a varus
deformity.  The pubic symphysis and SI joints are intact.  No
pelvic fractures.  The left hip is intact.
IMPRESSION: Displaced right femoral neck fracture.

## 2014-11-02 IMAGING — CR DG PORTABLE PELVIS
1 series · 1 of 1 positions shown · non-contrast
Comparison: 09/23/2012

CLINICAL DATA: Postoperative right total hip replacement.

PORTABLE PELVIS

[AP]
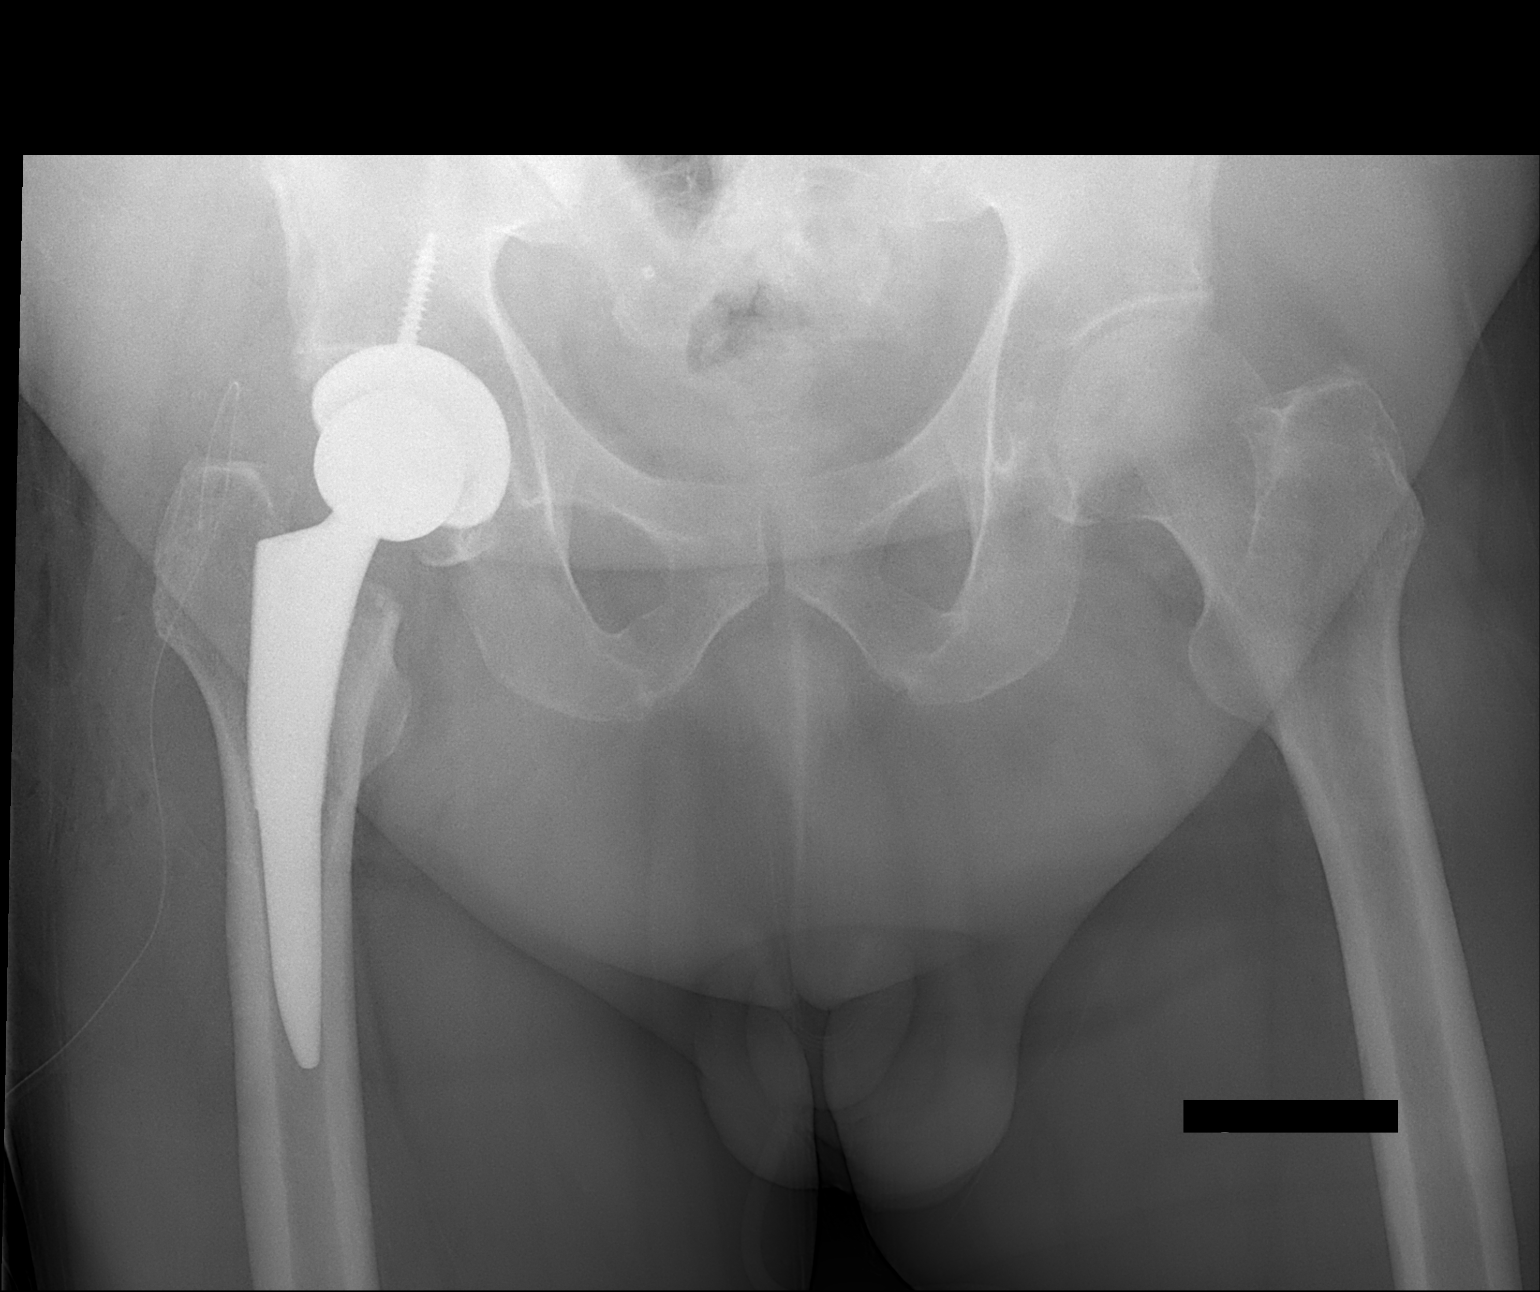

[1 of 1 positions shown; findings below may reference images not displayed]

FINDINGS: Right total hip prosthesis is in place without findings
of fracture, dislocation, or other acute complicating feature.
Lateral inclination of the acetabular shell component 39 degrees,
within normal limits.  A drain is in place.
IMPRESSION: 1.  Right total hip prosthesis noted.

## 2015-06-16 ENCOUNTER — Emergency Department (HOSPITAL_COMMUNITY): Payer: Worker's Compensation

## 2015-06-16 ENCOUNTER — Emergency Department (HOSPITAL_COMMUNITY)
Admission: EM | Admit: 2015-06-16 | Discharge: 2015-06-16 | Disposition: A | Discharge status: Home / Self Care | Payer: Worker's Compensation | Attending: Emergency Medicine | Admitting: Emergency Medicine

## 2015-06-16 ENCOUNTER — Encounter (HOSPITAL_COMMUNITY): Payer: Self-pay | Admitting: Emergency Medicine

## 2015-06-16 DIAGNOSIS — S4992XA Unspecified injury of left shoulder and upper arm, initial encounter: Secondary | ICD-10-CM | POA: Diagnosis present

## 2015-06-16 DIAGNOSIS — I10 Essential (primary) hypertension: Secondary | ICD-10-CM | POA: Insufficient documentation

## 2015-06-16 DIAGNOSIS — Z7984 Long term (current) use of oral hypoglycemic drugs: Secondary | ICD-10-CM | POA: Diagnosis not present

## 2015-06-16 DIAGNOSIS — F17210 Nicotine dependence, cigarettes, uncomplicated: Secondary | ICD-10-CM | POA: Diagnosis not present

## 2015-06-16 DIAGNOSIS — Z7982 Long term (current) use of aspirin: Secondary | ICD-10-CM | POA: Insufficient documentation

## 2015-06-16 DIAGNOSIS — Y998 Other external cause status: Secondary | ICD-10-CM | POA: Diagnosis not present

## 2015-06-16 DIAGNOSIS — S42292A Other displaced fracture of upper end of left humerus, initial encounter for closed fracture: Secondary | ICD-10-CM | POA: Insufficient documentation

## 2015-06-16 DIAGNOSIS — Z8669 Personal history of other diseases of the nervous system and sense organs: Secondary | ICD-10-CM | POA: Diagnosis not present

## 2015-06-16 DIAGNOSIS — S42302A Unspecified fracture of shaft of humerus, left arm, initial encounter for closed fracture: Secondary | ICD-10-CM

## 2015-06-16 DIAGNOSIS — E119 Type 2 diabetes mellitus without complications: Secondary | ICD-10-CM | POA: Diagnosis not present

## 2015-06-16 DIAGNOSIS — W000XXA Fall on same level due to ice and snow, initial encounter: Secondary | ICD-10-CM | POA: Diagnosis not present

## 2015-06-16 DIAGNOSIS — Y9389 Activity, other specified: Secondary | ICD-10-CM | POA: Insufficient documentation

## 2015-06-16 DIAGNOSIS — Y9289 Other specified places as the place of occurrence of the external cause: Secondary | ICD-10-CM | POA: Diagnosis not present

## 2015-06-16 DIAGNOSIS — Z79899 Other long term (current) drug therapy: Secondary | ICD-10-CM | POA: Insufficient documentation

## 2015-06-16 MED ORDER — OXYCODONE-ACETAMINOPHEN 5-325 MG PO TABS: 1.0000 | ORAL_TABLET | Freq: Four times a day (QID) | ORAL | Status: AC | PRN

## 2015-06-16 MED ORDER — HYDROMORPHONE HCL 1 MG/ML IJ SOLN
1.0000 mg | Freq: Once | INTRAMUSCULAR | Status: AC
  Administered 2015-06-16: 1 mg via INTRAMUSCULAR
  Filled 2015-06-16: qty 1

## 2015-06-16 NOTE — ED Provider Notes (Signed)
History  By signing my name below, I, Karle PlumberJennifer Tensley, attest that this documentation has been prepared under the direction and in the presence of Rob San IsidroBrowning, [place]-C. Electronically Signed: Karle PlumberJennifer Tensley, ED Scribe. 06/16/2015. 2:47 PM.  Chief Complaint  Patient presents with  . Arm Injury   The history is provided by the patient and medical records. No language interpreter was used.    HPI Comments:  Brady [name] is a 60 y.o. male who presents to the Emergency Department complaining of severe left arm pain that began yesterday secondary to slipping and falling on ice. He has taken two of his sister's Oxycodone for pain with significant relief of his pain. Moving the left arm increases the pain. He denies alleviating factors. He denies numbness, tingling or weakness of the LUE, head injury or LOC.  Past Medical History  Diagnosis Date  . Sleep apnea   . Diabetes mellitus without complication (HCC)   . Hypertension    Past Surgical History  Procedure Laterality Date  . 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 No pertinent family history. Social History  Substance Use Topics  . Smoking status: Current Every Day Smoker    Types: Cigarettes  . Smokeless tobacco: None  . Alcohol Use: Yes     Comment: daily    Review of Systems  Musculoskeletal: Positive for arthralgias.  Neurological: Negative for syncope, weakness and numbness.    Allergies  Review of patient's allergies indicates no known allergies.  Home Medications   Prior to Admission medications   Medication Sig Start Date End Date Taking? Authorizing Provider  aspirin 325 MG EC tablet Take 1 tablet (325 mg total) by mouth 2 (two) times daily. 09/26/12   Costin Otelia SergeantM Gherghe, MD  ferrous sulfate 325 (65 FE) MG tablet Take 1 tablet (325 mg total) by  mouth 3 (three) times daily after meals. 09/26/12   Costin Otelia SergeantM Gherghe, MD  HYDROcodone-acetaminophen (NORCO/VICODIN) 5-325 MG per tablet Take 1-2 tablets by mouth every 4 (four) hours as needed for pain. 09/25/12   Lanney GinsMatthew Babish, PA-C  KRILL OIL PO Take 2 capsules by mouth every morning.    Historical Provider, MD  lisinopril (PRINIVIL,ZESTRIL) 10 MG tablet Take 20 mg by mouth at bedtime.     Historical Provider, MD  metFORMIN (GLUCOPHAGE) 500 MG tablet Take 1,000 mg by mouth 2 (two) times daily with a meal. 07/09/12   Margarita Grizzleanielle Ray, MD  methocarbamol (ROBAXIN) 500 MG tablet Take 1 tablet (500 mg total) by mouth every 6 (six) hours as needed (muscle spasms). 09/25/12   Lanney GinsMatthew Babish, PA-C  metoprolol tartrate (LOPRESSOR) 25 MG tablet Take 25 mg by mouth daily.    Historical Provider, MD  Multiple Vitamins-Minerals (CENTRUM SILVER PO) Take 1 tablet by mouth every morning.    Historical Provider, MD  nicotine (NICODERM CQ - DOSED IN MG/24 HOURS) 21 mg/24hr patch Place 1 patch onto the skin daily. 09/26/12   Costin Otelia SergeantM Gherghe, MD  simvastatin (ZOCOR) 10 MG tablet Take 10 mg by mouth at bedtime.    Historical Provider, MD   Triage Vitals: BP 156/90 mmHg  Pulse 82  Temp(Src) 97.8 F (36.6 C) (Oral)  Resp 18  SpO2 96% Physical Exam Physical Exam  Constitutional: Pt appears well-developed and well-nourished. No distress.  HENT:  Head: Normocephalic and atraumatic.  Eyes: Conjunctivae are  normal.  Neck: Normal range of motion.  Cardiovascular: Normal rate, regular rhythm and intact distal pulses.   Capillary refill < 3 sec  Pulmonary/Chest: Effort normal and breath sounds normal.  Musculoskeletal: Pt exhibits tenderness. Pt exhibits no edema.  ROM: 0/5, limited by pain  Neurological: Pt  is alert. Coordination normal.  Sensation 5/5 Strength 0/5, limited by pain  Skin: Skin is warm and dry. Pt is not diaphoretic.  No tenting of the skin  Psychiatric: Pt has a normal mood and affect.  Nursing note  and vitals reviewed.  ED Course  Procedures (including critical care time) DIAGNOSTIC STUDIES: Oxygen Saturation is 96% on RA, normal by my interpretation.   COORDINATION OF CARE: 2:44 PM- Will order sling and speak with Dr. Denton Lank about ortho consult. Pt verbalizes understanding and agrees to plan.  Medications  HYDROmorphone (DILAUDID) injection 1 mg (not administered)   Imaging Review Dg Shoulder Left  06/16/2015  CLINICAL DATA:  Fall yesterday with left shoulder pain, initial encounter EXAM: LEFT SHOULDER - 2+ VIEW COMPARISON:  None. FINDINGS: There is a minimally displaced fracture of the proximal left humerus involving the surgical neck. Very mild impaction is noted at the fracture site. The humeral head is well seated. No other focal abnormality is noted. IMPRESSION: Proximal left humeral fracture at the surgical neck. Electronically Signed   By: Alcide Clever M.D.   On: 06/16/2015 14:30   I have personally reviewed and evaluated these images and lab results as part of my medical decision-making.   MDM   Final diagnoses:  Humerus fracture, left, closed, initial encounter    Patient with left humeral neck fracture. Discussed with Dr. Denton Lank, who recommends orthopedic follow-up. Will place patient in a sling and swath. Patient given prescription for Percocet. He understands and agrees with the follow-up plan. He is stable and ready for discharge. He is neurovascularly intact.  I personally performed the services described in this documentation, which was scribed in my presence. The recorded information has been reviewed and is accurate.       Roxy Horseman, PA-C 06/16/15 1544  Cathren Laine, MD 06/23/15 [phone]

## 2015-06-16 NOTE — Discharge Instructions (Signed)
Humerus Fracture Treated With Immobilization °The humerus is the large bone in your upper arm. You have a broken (fractured) humerus. These fractures are easily diagnosed with X-rays. °TREATMENT  °Simple fractures which will heal without disability are treated with simple immobilization. Immobilization means you will wear a cast, splint, or sling. You have a fracture which will do well with immobilization. The fracture will heal well simply by being held in a good position until it is stable enough to begin range of motion exercises. Do not take part in activities which would further injure your arm.  °HOME CARE INSTRUCTIONS  °· Put ice on the injured area. °¨ Put ice in a plastic bag. °¨ Place a towel between your skin and the bag. °¨ Leave the ice on for 15-20 minutes, 03-04 times a day. °· If you have a cast: °¨ Do not scratch the skin under the cast using sharp or pointed objects. °¨ Check the skin around the cast every day. You may put lotion on any red or sore areas. °¨ Keep your cast dry and clean. °· If you have a splint: °¨ Wear the splint as directed. °¨ Keep your splint dry and clean. °¨ You may loosen the elastic around the splint if your fingers become numb, tingle, or turn cold or blue. °· If you have a sling: °¨ Wear the sling as directed. °· Do not put pressure on any part of your cast or splint until it is fully hardened. °· Your cast or splint can be protected during bathing with a plastic bag. Do not lower the cast or splint into water. °· Only take over-the-counter or prescription medicines for pain, discomfort, or fever as directed by your caregiver. °· Do range of motion exercises as instructed by your caregiver. °· Follow up as directed by your caregiver. This is very important in order to avoid permanent injury or disability and chronic pain. °SEEK IMMEDIATE MEDICAL CARE IF:  °· Your skin or nails in the injured arm turn blue or gray. °· Your arm feels cold or numb. °· You develop severe pain  in the injured arm. °· You are having problems with the medicines you were given. °MAKE SURE YOU:  °· Understand these instructions. °· Will watch your condition. °· Will get help right away if you are not doing well or get worse. °  °This information is not intended to replace advice given to you by your health care provider. Make sure you discuss any questions you have with your health care provider. °  °Document Released: 08/29/2000 Document Revised: 06/13/2014 Document Reviewed: 10/15/2014 °Elsevier Interactive Patient Education ©2016 [employer] ° °

## 2015-06-16 NOTE — ED Notes (Signed)
Pt slipped on ice yesterday. Placed his left arm in a sling, pain worse today. States pain is below left shoulder joint, worse with movement. Pulse, movement, sensation intact in fingers.

## 2015-06-16 NOTE — ED Notes (Signed)
Ortho tec notified to apply sling
